# Patient Record
Sex: Female | Born: 1935 | Race: White | Hispanic: No | Smoking: Former smoker
Health system: Southern US, Community
[De-identification: ages and names within clinical notes are randomized; demographics above are authoritative.]

## PROBLEM LIST (undated history)

## (undated) DIAGNOSIS — F32A Depression, unspecified: Secondary | ICD-10-CM

## (undated) DIAGNOSIS — F0280 Dementia in other diseases classified elsewhere without behavioral disturbance: Secondary | ICD-10-CM

## (undated) DIAGNOSIS — G309 Alzheimer's disease, unspecified: Secondary | ICD-10-CM

## (undated) DIAGNOSIS — F329 Major depressive disorder, single episode, unspecified: Secondary | ICD-10-CM

## (undated) DIAGNOSIS — F419 Anxiety disorder, unspecified: Secondary | ICD-10-CM

## (undated) DIAGNOSIS — G2581 Restless legs syndrome: Secondary | ICD-10-CM

## (undated) DIAGNOSIS — G8929 Other chronic pain: Secondary | ICD-10-CM

## (undated) DIAGNOSIS — E039 Hypothyroidism, unspecified: Secondary | ICD-10-CM

## (undated) DIAGNOSIS — J449 Chronic obstructive pulmonary disease, unspecified: Secondary | ICD-10-CM

## (undated) DIAGNOSIS — R0902 Hypoxemia: Secondary | ICD-10-CM

## (undated) DIAGNOSIS — E162 Hypoglycemia, unspecified: Secondary | ICD-10-CM

## (undated) DIAGNOSIS — F0390 Unspecified dementia without behavioral disturbance: Secondary | ICD-10-CM

## (undated) HISTORY — PX: OTHER SURGICAL HISTORY: SHX169

---

## 1998-01-11 ENCOUNTER — Emergency Department (HOSPITAL_COMMUNITY): Admission: EM | Admit: 1998-01-11 | Discharge: 1998-01-11 | Payer: Self-pay | Admitting: Emergency Medicine

## 2004-01-17 ENCOUNTER — Other Ambulatory Visit: Payer: Self-pay

## 2004-03-16 ENCOUNTER — Other Ambulatory Visit: Payer: Self-pay

## 2004-03-16 ENCOUNTER — Emergency Department: Payer: Self-pay | Admitting: Emergency Medicine

## 2004-04-26 ENCOUNTER — Emergency Department: Payer: Self-pay | Admitting: Emergency Medicine

## 2004-04-26 ENCOUNTER — Other Ambulatory Visit: Payer: Self-pay

## 2004-08-19 ENCOUNTER — Inpatient Hospital Stay: Payer: Self-pay | Admitting: Endocrinology

## 2004-08-31 ENCOUNTER — Emergency Department (HOSPITAL_COMMUNITY): Admission: EM | Admit: 2004-08-31 | Discharge: 2004-09-01 | Payer: Self-pay | Admitting: Emergency Medicine

## 2004-09-04 ENCOUNTER — Emergency Department (HOSPITAL_COMMUNITY): Admission: EM | Admit: 2004-09-04 | Discharge: 2004-09-04 | Payer: Self-pay | Admitting: Emergency Medicine

## 2004-10-13 ENCOUNTER — Emergency Department (HOSPITAL_COMMUNITY): Admission: EM | Admit: 2004-10-13 | Discharge: 2004-10-13 | Payer: Self-pay | Admitting: Emergency Medicine

## 2005-04-02 ENCOUNTER — Emergency Department (HOSPITAL_COMMUNITY): Admission: EM | Admit: 2005-04-02 | Discharge: 2005-04-02 | Payer: Self-pay | Admitting: Emergency Medicine

## 2005-12-07 ENCOUNTER — Emergency Department (HOSPITAL_COMMUNITY): Admission: EM | Admit: 2005-12-07 | Discharge: 2005-12-07 | Payer: Self-pay | Admitting: Emergency Medicine

## 2006-01-24 ENCOUNTER — Ambulatory Visit (HOSPITAL_COMMUNITY): Admission: RE | Admit: 2006-01-24 | Discharge: 2006-01-24 | Payer: Self-pay | Admitting: Internal Medicine

## 2006-09-24 ENCOUNTER — Emergency Department (HOSPITAL_COMMUNITY): Admission: EM | Admit: 2006-09-24 | Discharge: 2006-09-24 | Payer: Self-pay | Admitting: Emergency Medicine

## 2006-10-25 ENCOUNTER — Ambulatory Visit (HOSPITAL_COMMUNITY): Admission: RE | Admit: 2006-10-25 | Discharge: 2006-10-25 | Payer: Self-pay | Admitting: *Deleted

## 2007-02-06 ENCOUNTER — Emergency Department (HOSPITAL_COMMUNITY): Admission: EM | Admit: 2007-02-06 | Discharge: 2007-02-07 | Payer: Self-pay | Admitting: Emergency Medicine

## 2007-03-21 ENCOUNTER — Emergency Department (HOSPITAL_COMMUNITY): Admission: EM | Admit: 2007-03-21 | Discharge: 2007-03-21 | Payer: Self-pay | Admitting: Emergency Medicine

## 2007-06-28 ENCOUNTER — Encounter: Admission: RE | Admit: 2007-06-28 | Discharge: 2007-06-28 | Payer: Self-pay | Admitting: *Deleted

## 2007-06-28 ENCOUNTER — Inpatient Hospital Stay (HOSPITAL_COMMUNITY): Admission: EM | Admit: 2007-06-28 | Discharge: 2007-07-07 | Payer: Self-pay | Admitting: Emergency Medicine

## 2007-08-02 ENCOUNTER — Emergency Department (HOSPITAL_COMMUNITY): Admission: EM | Admit: 2007-08-02 | Discharge: 2007-08-02 | Payer: Self-pay | Admitting: Emergency Medicine

## 2008-04-15 ENCOUNTER — Emergency Department (HOSPITAL_COMMUNITY): Admission: EM | Admit: 2008-04-15 | Discharge: 2008-04-15 | Payer: Self-pay | Admitting: Emergency Medicine

## 2008-05-23 ENCOUNTER — Emergency Department (HOSPITAL_COMMUNITY): Admission: EM | Admit: 2008-05-23 | Discharge: 2008-05-23 | Payer: Self-pay | Admitting: Emergency Medicine

## 2008-09-02 ENCOUNTER — Emergency Department (HOSPITAL_COMMUNITY): Admission: EM | Admit: 2008-09-02 | Discharge: 2008-09-03 | Payer: Self-pay | Admitting: Emergency Medicine

## 2008-11-04 ENCOUNTER — Observation Stay (HOSPITAL_COMMUNITY): Admission: EM | Admit: 2008-11-04 | Discharge: 2008-11-05 | Payer: Self-pay | Admitting: Emergency Medicine

## 2009-03-01 ENCOUNTER — Inpatient Hospital Stay (HOSPITAL_COMMUNITY): Admission: EM | Admit: 2009-03-01 | Discharge: 2009-03-03 | Payer: Self-pay | Admitting: Emergency Medicine

## 2009-03-02 ENCOUNTER — Encounter (INDEPENDENT_AMBULATORY_CARE_PROVIDER_SITE_OTHER): Payer: Self-pay | Admitting: Internal Medicine

## 2009-03-02 ENCOUNTER — Ambulatory Visit: Payer: Self-pay | Admitting: Surgery

## 2009-03-09 ENCOUNTER — Encounter (INDEPENDENT_AMBULATORY_CARE_PROVIDER_SITE_OTHER): Payer: Self-pay | Admitting: *Deleted

## 2009-04-21 ENCOUNTER — Encounter: Admission: RE | Admit: 2009-04-21 | Discharge: 2009-04-21 | Payer: Self-pay | Admitting: Geriatric Medicine

## 2009-04-29 ENCOUNTER — Encounter: Admission: RE | Admit: 2009-04-29 | Discharge: 2009-04-29 | Payer: Self-pay | Admitting: Geriatric Medicine

## 2009-05-08 ENCOUNTER — Inpatient Hospital Stay (HOSPITAL_COMMUNITY): Admission: EM | Admit: 2009-05-08 | Discharge: 2009-05-09 | Payer: Self-pay | Admitting: Emergency Medicine

## 2009-06-15 ENCOUNTER — Ambulatory Visit: Payer: Self-pay | Admitting: Diagnostic Radiology

## 2009-06-15 ENCOUNTER — Emergency Department (HOSPITAL_BASED_OUTPATIENT_CLINIC_OR_DEPARTMENT_OTHER): Admission: EM | Admit: 2009-06-15 | Discharge: 2009-06-15 | Payer: Self-pay | Admitting: Emergency Medicine

## 2009-07-14 ENCOUNTER — Ambulatory Visit (HOSPITAL_COMMUNITY): Admission: RE | Admit: 2009-07-14 | Discharge: 2009-07-14 | Payer: Self-pay | Admitting: Geriatric Medicine

## 2009-10-25 ENCOUNTER — Emergency Department (HOSPITAL_COMMUNITY): Admission: EM | Admit: 2009-10-25 | Discharge: 2009-10-25 | Payer: Self-pay | Admitting: Emergency Medicine

## 2009-11-02 IMAGING — CR DG CHEST 1V PORT
1 series · 1 of 1 positions shown · non-contrast
Comparison: 06/28/07 earlier today.

CLINICAL DATA: Fever and shortness of breath.  Pneumonia.  
 PORTABLE CHEST - 1 VIEW 06/28/07:

[view not recorded]
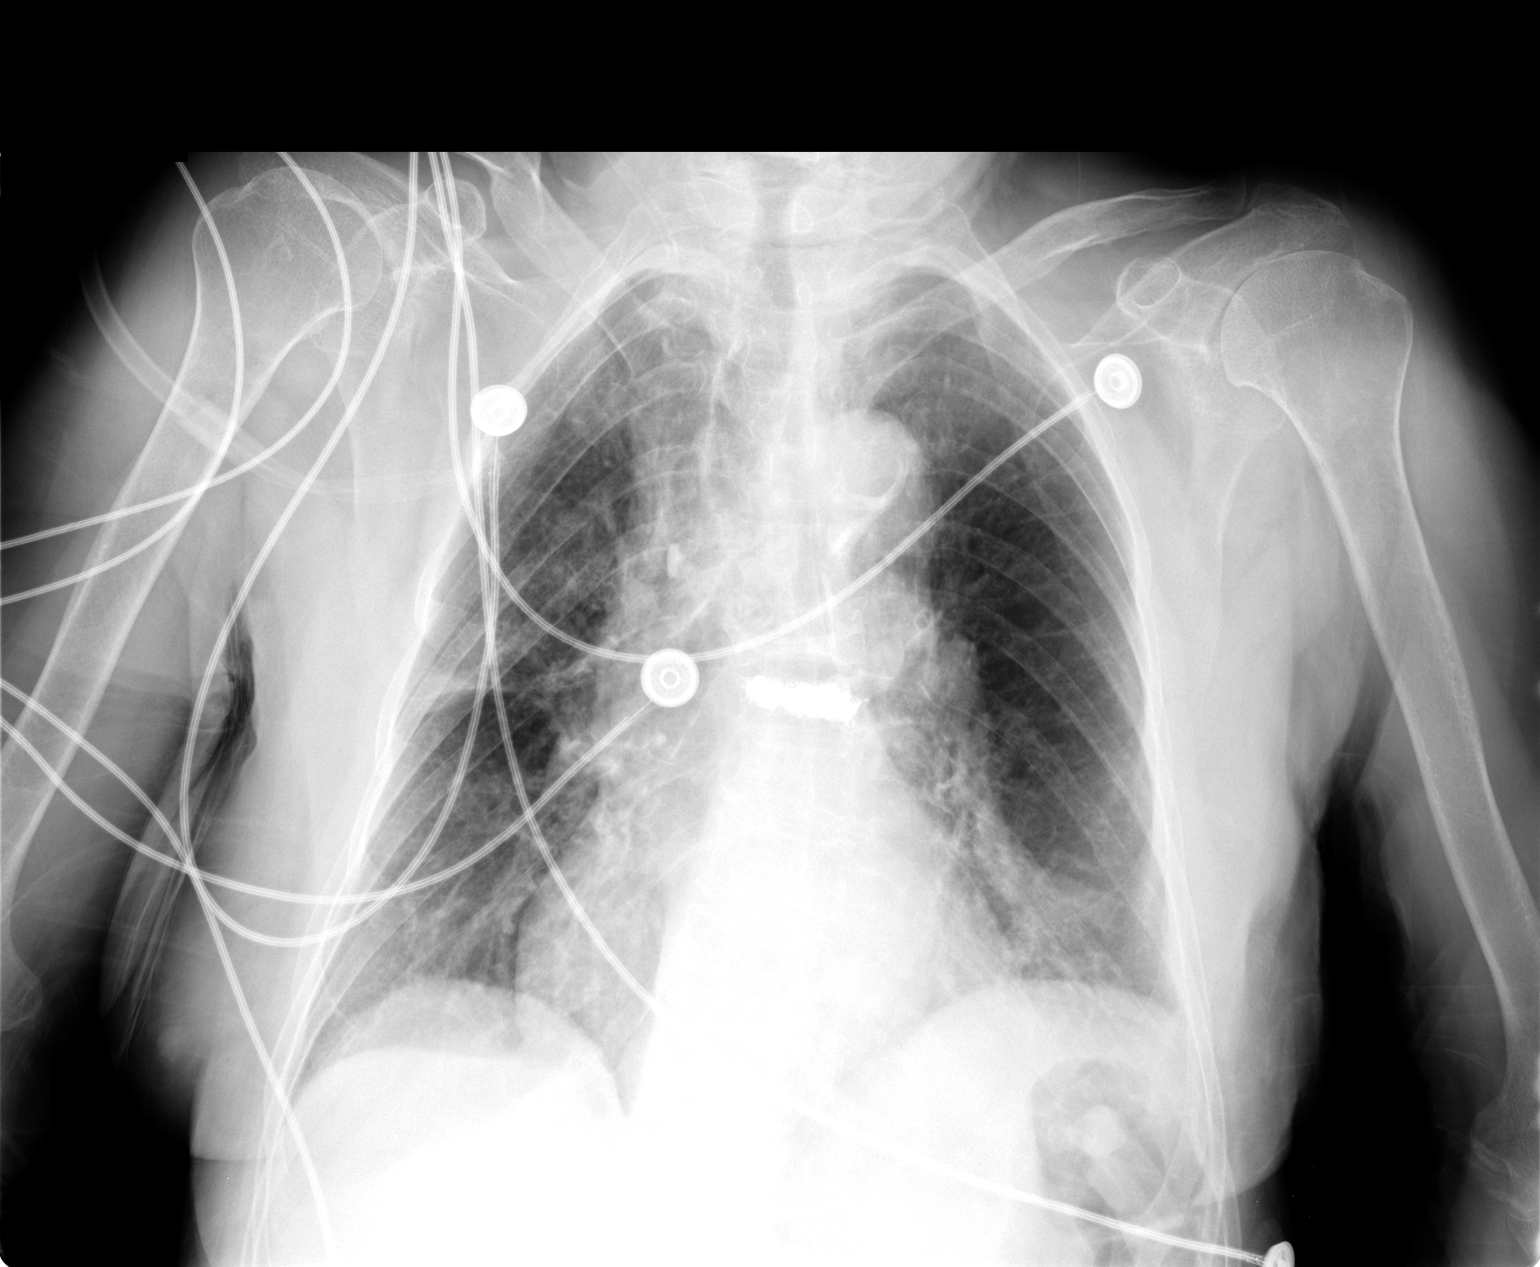

[1 of 1 positions shown; findings below may reference images not displayed]

FINDINGS: Compared to prior study earlier today, patchy infiltrate is again seen within the lingular, consistent with pneumonia.  There is mild atelectasis or scarring in the right mid lung.  Cardiomegaly is stable.  There is no evidence of congestive heart failure.
IMPRESSION: 1.  Lingular infiltrate, without significant change. 
 2.  Mild right midlung atelectasis vs scarring also stable. 
 3.  Stable cardiomegaly.

## 2009-11-02 IMAGING — CR DG CHEST 2V
2 series · 2 of 2 positions shown · non-contrast
Comparison: 03/21/07

CLINICAL DATA: Cough.   Congestion.  Short of breath. 
 CHEST - 2 VIEW:

[view not recorded (1 of 2)]
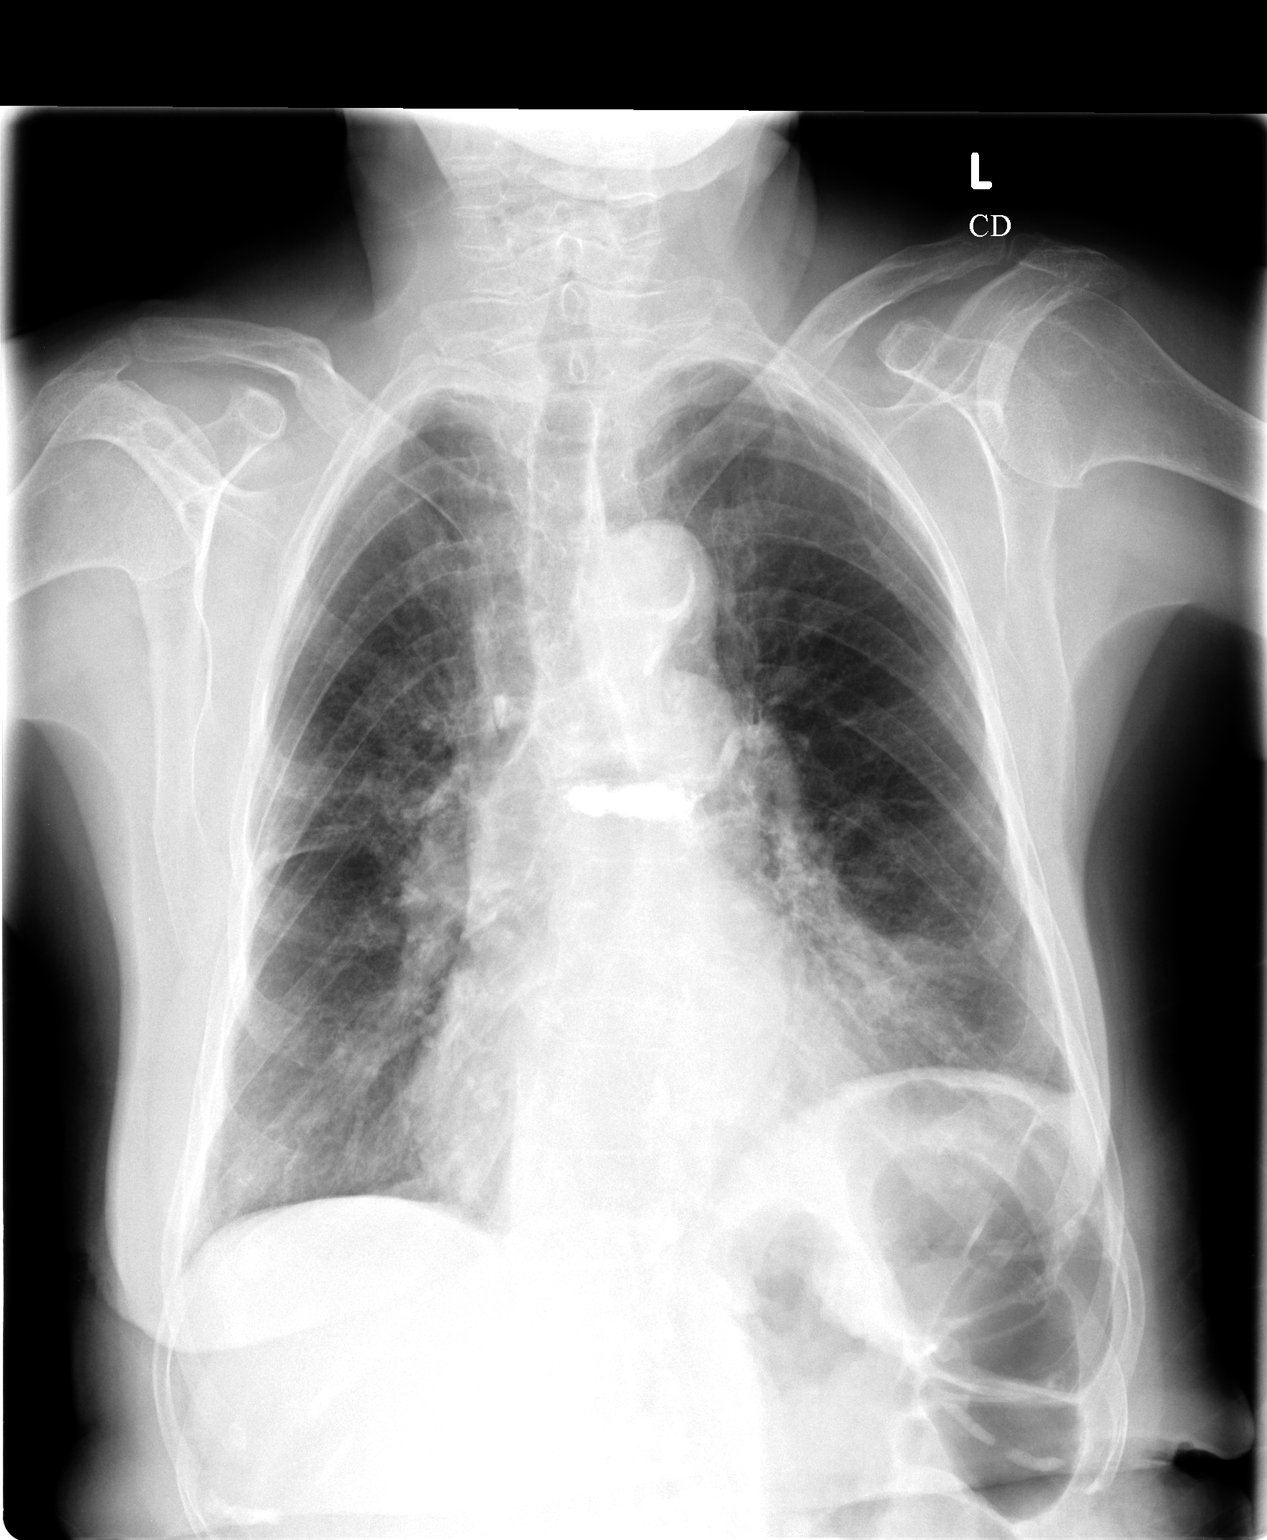

[view not recorded (2 of 2)]
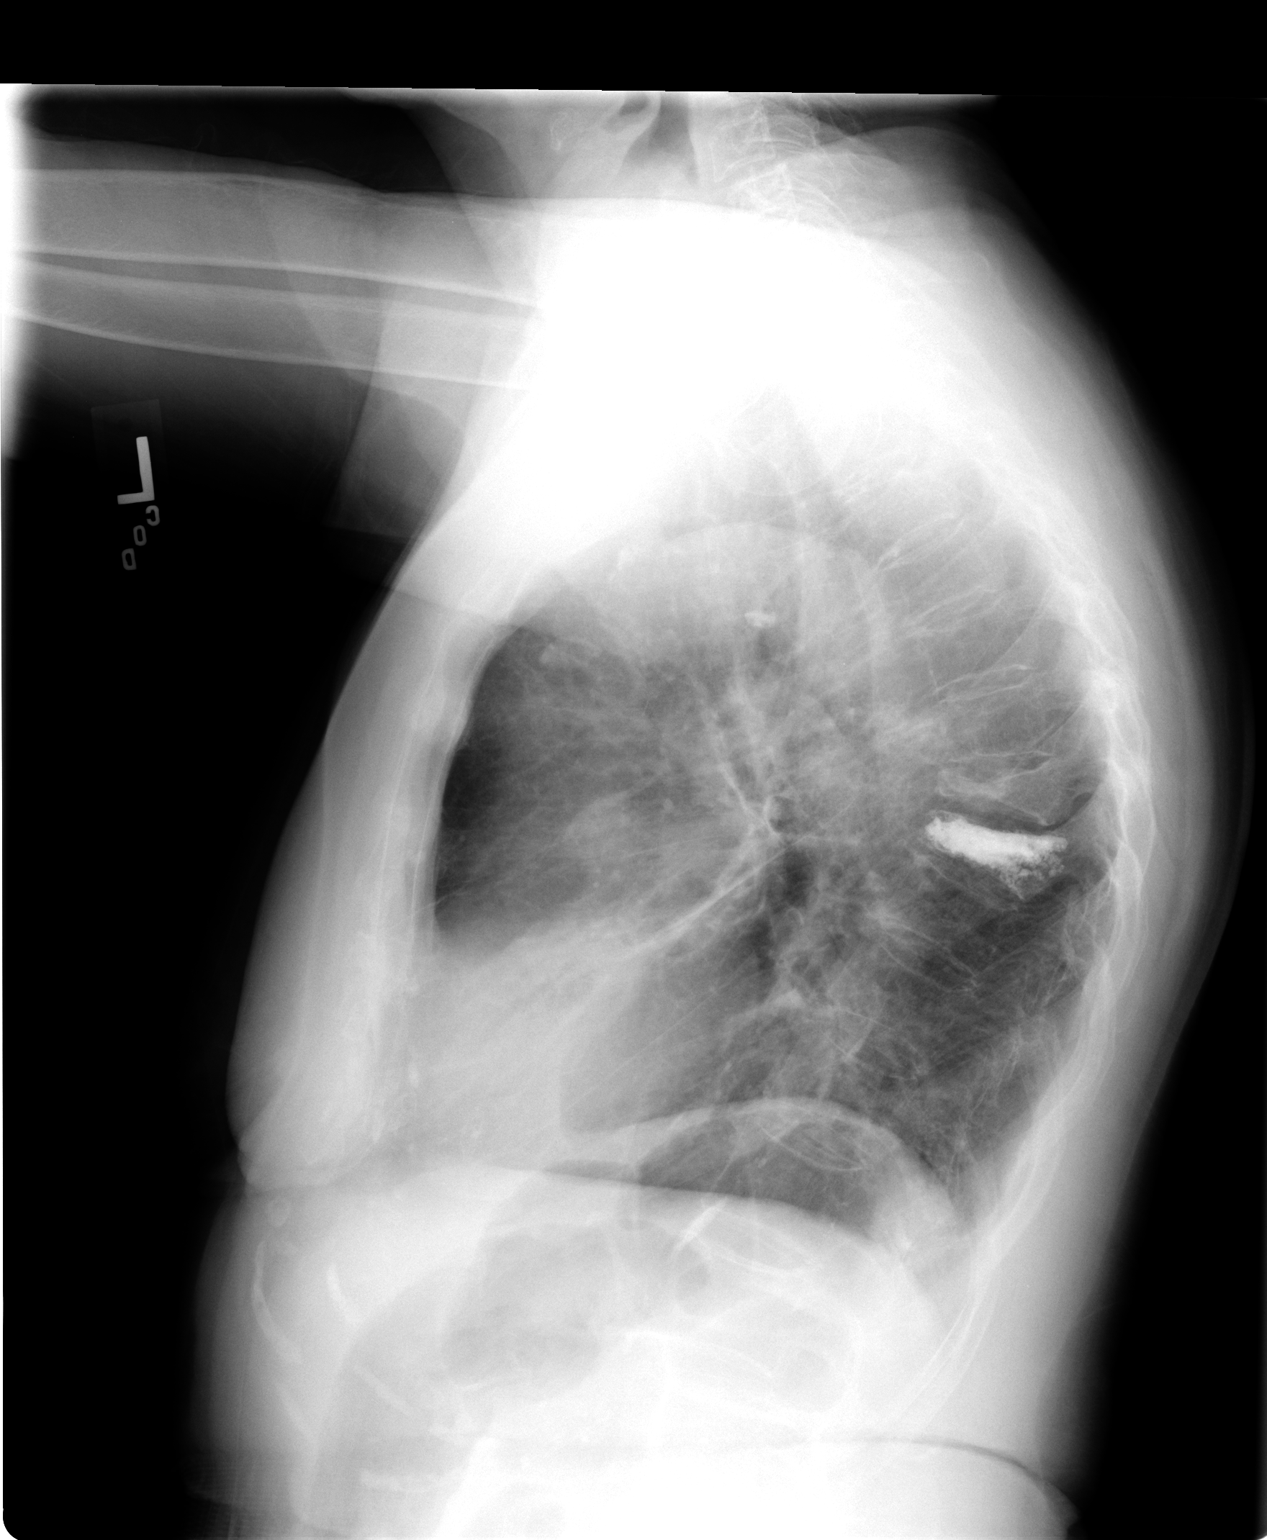

[2 of 2 positions shown; findings below may reference images not displayed]

FINDINGS: Two views of the chest show parenchymal opacity at the left lung base which on the lateral view may involve the lingula, most consistent with pneumonia.  No effusion is seen.  The remainder of the lungs are clear.  Heart size is stable.  The bones are osteopenic and a mid thoracic vertebral body compression deformities again are noted.
IMPRESSION: Patchy opacity anteriorly in region of lingula consistent with lingular pneumonia.

## 2009-12-28 ENCOUNTER — Emergency Department (HOSPITAL_BASED_OUTPATIENT_CLINIC_OR_DEPARTMENT_OTHER): Admission: EM | Admit: 2009-12-28 | Discharge: 2009-12-28 | Payer: Self-pay | Admitting: Emergency Medicine

## 2009-12-28 ENCOUNTER — Ambulatory Visit: Payer: Self-pay | Admitting: Diagnostic Radiology

## 2009-12-29 ENCOUNTER — Ambulatory Visit: Payer: Self-pay | Admitting: Diagnostic Radiology

## 2009-12-29 ENCOUNTER — Emergency Department (HOSPITAL_BASED_OUTPATIENT_CLINIC_OR_DEPARTMENT_OTHER): Admission: EM | Admit: 2009-12-29 | Discharge: 2009-12-29 | Payer: Self-pay | Admitting: Emergency Medicine

## 2010-03-13 ENCOUNTER — Ambulatory Visit: Payer: Self-pay | Admitting: Interventional Radiology

## 2010-03-13 ENCOUNTER — Emergency Department (HOSPITAL_BASED_OUTPATIENT_CLINIC_OR_DEPARTMENT_OTHER): Admission: EM | Admit: 2010-03-13 | Discharge: 2010-03-13 | Payer: Self-pay | Admitting: Emergency Medicine

## 2010-04-02 ENCOUNTER — Emergency Department (HOSPITAL_BASED_OUTPATIENT_CLINIC_OR_DEPARTMENT_OTHER): Admission: EM | Admit: 2010-04-02 | Discharge: 2010-04-02 | Payer: Self-pay | Admitting: Emergency Medicine

## 2010-04-02 ENCOUNTER — Ambulatory Visit: Payer: Self-pay | Admitting: Diagnostic Radiology

## 2010-04-12 ENCOUNTER — Ambulatory Visit: Payer: Self-pay | Admitting: Diagnostic Radiology

## 2010-04-12 ENCOUNTER — Emergency Department (HOSPITAL_BASED_OUTPATIENT_CLINIC_OR_DEPARTMENT_OTHER): Admission: EM | Admit: 2010-04-12 | Discharge: 2010-04-12 | Payer: Self-pay | Admitting: Emergency Medicine

## 2010-04-30 ENCOUNTER — Encounter (INDEPENDENT_AMBULATORY_CARE_PROVIDER_SITE_OTHER): Payer: Self-pay | Admitting: *Deleted

## 2010-04-30 ENCOUNTER — Ambulatory Visit: Payer: Self-pay | Admitting: Diagnostic Radiology

## 2010-04-30 ENCOUNTER — Emergency Department (HOSPITAL_BASED_OUTPATIENT_CLINIC_OR_DEPARTMENT_OTHER): Admission: EM | Admit: 2010-04-30 | Discharge: 2010-04-30 | Payer: Self-pay | Admitting: Emergency Medicine

## 2010-05-10 ENCOUNTER — Emergency Department (HOSPITAL_BASED_OUTPATIENT_CLINIC_OR_DEPARTMENT_OTHER): Admission: EM | Admit: 2010-05-10 | Discharge: 2010-05-10 | Payer: Self-pay | Admitting: Emergency Medicine

## 2010-05-10 ENCOUNTER — Encounter (INDEPENDENT_AMBULATORY_CARE_PROVIDER_SITE_OTHER): Payer: Self-pay | Admitting: *Deleted

## 2010-05-20 ENCOUNTER — Emergency Department (HOSPITAL_BASED_OUTPATIENT_CLINIC_OR_DEPARTMENT_OTHER)
Admission: EM | Admit: 2010-05-20 | Discharge: 2010-05-20 | Payer: Self-pay | Source: Home / Self Care | Admitting: Emergency Medicine

## 2010-05-25 ENCOUNTER — Encounter (INDEPENDENT_AMBULATORY_CARE_PROVIDER_SITE_OTHER): Payer: Self-pay | Admitting: *Deleted

## 2010-05-26 ENCOUNTER — Telehealth: Payer: Self-pay | Admitting: Gastroenterology

## 2010-07-02 ENCOUNTER — Ambulatory Visit: Admit: 2010-07-02 | Payer: Self-pay | Admitting: Gastroenterology

## 2010-07-15 NOTE — Progress Notes (Signed)
Summary: triage  Phone Note From Other Clinic Call back at [phone]   Caller: Aldean Jewett, with [facility] Call For: Dr. Christella Hartigan Reason for Call: Schedule Patient Appt Summary of Call: wants pt to be seen sooner than currently scheduled- January 20th Initial call taken by: Vallarie Mare,  May 26, 2010 8:27 AM  Follow-up for Phone Call        pt is not having any problems just a follow up from the ER for hemorrhoids.  Dr Christella Hartigan has no appts any sooner,  she will call back if the pt develops symptoms or problems to be triaged Follow-up by: Chales Abrahams CMA Duncan Dull),  May 26, 2010 8:47 AM

## 2010-07-15 NOTE — Discharge Summary (Signed)
Summary: Abdominal pain  NAME:  Erin Davis, Erin Davis              ACCOUNT NO.:  [id number]      MEDICAL RECORD NO.:  [id number]          PATIENT TYPE:  INP      LOCATION:  1517                         FACILITY:  [facility]      PHYSICIAN:  Jeoffrey Massed, MD    DATE OF BIRTH:  06-16-1921      DATE OF ADMISSION:  05/08/2009   DATE OF DISCHARGE:  05/09/2009                                  DISCHARGE SUMMARY      PRIMARY CARE PRACTITIONER:  Dr. Tamsen Meek.  Patient is a resident of   [facility].      PRIMARY DISCHARGE DIAGNOSES:   1. Abdominal pain.   2. Urinary tract infection.      SECONDARY DISCHARGE DIAGNOSES:   1. History of chronic obstructive pulmonary disease.   2. Hypothyroidism.   3. Dementia.   4. History of recurrent urinary tract infection.   5. History of rectal bleeding.   6. Chronic pain syndrome.   7. Restless leg syndrome.   8. Anxiety.   9. Depression.      DISCHARGE MEDICATIONS:  Includes:   1. Bactrim-DS 1 tab twice daily for 3 days.   2. Aricept 10 mg 1 tab daily q.h.s.   3. Aspirin 1 tab p.o. daily.   4. Bacitracin ointment 1 application topically 3 times a day.   5. Carnation Instant B-Fast 1 packet p.o. every 8 hours.   6. Desonide 0.05% cream 1 application topically twice daily.   7. Imodium 2 mg 1 tablet daily p.r.n.   8. Mycostatin 100,000 units/gram 1 application topically daily as       needed.   9. Namenda 10 mg 1 tablet p.o. q.12.   10.Potassium chloride liquid 15 mL p.o. daily.   11.Simvastatin 5 mg 1 tablet p.o. daily at bedtime.   12.Synthroid 25 mcg 1.5 tablets p.o. daily.   13.Trazodone 50 mg 1 tablet p.o. daily at bedtime.   14.Tylenol Extra Strength 1 tablet p.o. q.6 p.r.n.   15.Vitamin D 3000 units 1 capsule p.o. daily.   16.Xopenex nebulizer 0.63 mg 1 nebulization inhaled q.8 hours..   17.Baza cream 1  application topically daily as needed.   18.Delsym 1 tablespoon p.o. q.12 p.r.n.      CONSULTATION:  Patient was seen in consult by  Dr. Johna Sheriff from Surgery   on May 08, 2009.      BRIEF HISTORY AND PHYSICAL:  An 77 year old female with a history of   dementia, chronic pain syndrome, admitted with abdominal pain.  Patient   is a very poor historian and apparently described the pain to have   started around 2 to 3 days ago and had become severe on the day of   admission.  She was sent over to the [facility] ED for further   evaluation and treatment.  She was then admitted to the [facility].  For further details, please see the history and   physical that was dictated by Dr. Lytle Michaels. Kim on the day of admission.  PERTINENT RADIOLOGICAL STUDIES:   1. An x-ray of the abdomen was done, which shows gaseous distention of       the stomach and several small bowel loops.  Partial low-grade small       bowel obstruction cannot be excluded.   2. Cardiomegaly and COPD; no active lung disease.   3. CT of the abdomen and pelvis, which showed:       a.     Multiple small hypodensities within both kidneys likely        reflecting small cysts.       b.     Linear atelectasis, scarring at the right lung base.       c.     A 6 mm nodular density along the left basilar pleura;        malignancy cannot be entirely excluded.       d.     No acute abnormalities identified within the pelvis.   4. A CT of the chest without contrast, which showed a solitary 6 mm       indeterminate pulmonary nodule in the left lateral lung base.      PERTINENT LABORATORY DATA:   1. CBC showed a WBC of 7, hemoglobin of 13.6, and a platelet count of       240.   2. Chemistry showed a sodium of 141, potassium of 4.1, creatinine of       1.14.   3. Lipase was normal at 58.      A urinalyses showed nitrite positive, moderate leukocytes.      Urine microscopic analysis showed WBCs 3 to 6 and many bacteria.      BRIEF HOSPITAL COURSE:   1. Abdominal pain.  The patient was admitted with abdominal pain.  A       surgical  consult was obtained in the ER and was felt to be a       nonsurgical issue.  By the time the Hospitalist Team went to admit       the patient, the patient was utterly pain free.  However, a CT of       the abdomen and pelvis was ordered, it was done, and it showed no       acute abnormality.  The patient has had no further abdominal pain       nor has she had any nausea or vomiting in the hospital.  Her diet       has already been advanced to as tolerated.  She is completely pain       free.  The etiology of the pain has not yet been determined;       however, it could be gastritis or a change in gastroparesis or       gastroenteritis.   2. UTI.Marland Kitchen  Her urinalysis was dirty.  The patient will be discharged on       a 3-day course of Bactrim.   3. COPD.  Stable during admission.   4. History of dementia.  Will continue the Aricept and Namenda.   5. Hypothyroidism.  Will continue Synthroid.   6. A left lower lung nodule 6 mm in size.  The history is unreliable,       but the patient claims to be an ex-smoker.  Will need to recheck a       CT of the chest in 6 months time.   7. Hyperlipidemia.  Continue Zocor.  DISPOSITION:  The patient to be discharged back to the nursing home.      CONDITION ON DISCHARGE:  Stable, pain free.      FOLLOWUP:   1. The patient has a 6 mm pulmonary nodule in her lung.  Given her       questionable history of smoking, it is recommended that a CT of the       chest be followed up in 6 months time.   2. Follow up with the primary care practitioner in 1 week time.               Jeoffrey Massed, MD   Electronically Signed            SG/MEDQ  D:  05/09/2009  T:  05/09/2009  Job:  [id number]

## 2010-07-15 NOTE — Letter (Signed)
Summary: New Patient letter  [facility]  [address] [place], [place] [address]   Phone: [phone]  Fax: [phone]       05/25/2010 MRN: [id number]  Erin Davis [address] [place], [place]  [address]  Dear Ms. Eischen,  Welcome to the Gastroenterology Division at [facility].    You are scheduled to see Dr.  Christella Hartigan on 07-02-10 at 1:30p.m. on the 3rd floor at [facility], [address]  We ask that you try to arrive at our office 15 minutes prior to your appointment time to allow for check-in.  We would like you to complete the enclosed self-administered evaluation form prior to your visit and bring it with you on the day of your appointment.  We will review it with you.  Also, please bring a complete list of all your medications or, if you prefer, bring the medication bottles and we will list them.  Please bring your insurance card so that we may make a copy of it.  If your insurance requires a referral to see a specialist, please bring your referral form from your primary care physician.  Co-payments are due at the time of your visit and may be paid by cash, check or credit card.     Your office visit will consist of a consult with your physician (includes a physical exam), any laboratory testing he/she may order, scheduling of any necessary diagnostic testing (e.g. x-ray, ultrasound, CT-scan), and scheduling of a procedure (e.g. Endoscopy, Colonoscopy) if required.  Please allow enough time on your schedule to allow for any/all of these possibilities.    If you cannot keep your appointment, please call [phone] to cancel or reschedule prior to your appointment date.  This allows [place] the opportunity to schedule an appointment for another patient in need of care.  If you do not cancel or reschedule by 5 p.m. the business day prior to your appointment date, you will be charged a $50.00 late cancellation/no-show fee.    Thank you for choosing  [facility] for your medical needs.  We appreciate the opportunity to care for you.  Please visit [place] at our website  to learn more about our practice.                     Sincerely,                                                             The Gastroenterology Division

## 2010-08-16 ENCOUNTER — Ambulatory Visit: Payer: Self-pay | Admitting: Gastroenterology

## 2010-08-24 LAB — URINALYSIS, ROUTINE W REFLEX MICROSCOPIC
Bilirubin Urine: NEGATIVE
Bilirubin Urine: NEGATIVE
Glucose, UA: NEGATIVE mg/dL
Hgb urine dipstick: NEGATIVE
Hgb urine dipstick: NEGATIVE
Ketones, ur: NEGATIVE mg/dL
Leukocytes, UA: NEGATIVE
Nitrite: NEGATIVE
Nitrite: NEGATIVE
Protein, ur: 30 mg/dL — AB
Specific Gravity, Urine: 1.025 (ref 1.005–1.030)
Specific Gravity, Urine: 1.026 (ref 1.005–1.030)
Urobilinogen, UA: 0.2 mg/dL (ref 0.0–1.0)
Urobilinogen, UA: 0.2 mg/dL (ref 0.0–1.0)
pH: 5.5 (ref 5.0–8.0)
pH: 5.5 (ref 5.0–8.0)

## 2010-08-24 LAB — BASIC METABOLIC PANEL
Calcium: 9.2 mg/dL (ref 8.4–10.5)
GFR calc Af Amer: >60 mL/min (ref >60)
GFR calc non Af Amer: >60 mL/min (ref >60)
Glucose, Bld: 97 mg/dL (ref 70–99)
Potassium: 4.4 mEq/L (ref 3.5–5.1)
Sodium: 146 mEq/L — ABNORMAL HIGH (ref 135–145)

## 2010-08-24 LAB — DIFFERENTIAL
Basophils Relative: 1 % (ref 0–1)
Lymphocytes Relative: 26 % (ref 12–46)
Lymphs Abs: 1.3 10*3/uL (ref 0.7–4.0)
Monocytes Absolute: 0.6 10*3/uL (ref 0.1–1.0)
Monocytes Relative: 12 % (ref 3–12)
Neutro Abs: 3 10*3/uL (ref 1.7–7.7)
Neutrophils Relative %: 60 % (ref 43–77)

## 2010-08-24 LAB — URINE CULTURE
Colony Count: NO GROWTH
Culture  Setup Time: 201111190121
Culture: NO GROWTH

## 2010-08-24 LAB — URINE MICROSCOPIC-ADD ON

## 2010-08-24 LAB — CBC
HCT: 37.9 % (ref 36.0–46.0)
Hemoglobin: 12.9 g/dL (ref 12.0–15.0)
RBC: 4.08 MIL/uL (ref 3.87–5.11)

## 2010-08-24 LAB — HEMOCCULT GUIAC POC 1CARD (OFFICE): Fecal Occult Bld: NEGATIVE

## 2010-08-25 LAB — CBC
HCT: 39.2 % (ref 36.0–46.0)
Hemoglobin: 13.1 g/dL (ref 12.0–15.0)
Hemoglobin: 13.4 g/dL (ref 12.0–15.0)
MCH: 32.7 pg (ref 26.0–34.0)
MCHC: 33.5 g/dL (ref 30.0–36.0)
MCV: 96.6 fL (ref 78.0–100.0)
Platelets: 209 10*3/uL (ref 150–400)
RBC: 4.09 MIL/uL (ref 3.87–5.11)
WBC: 5.3 10*3/uL (ref 4.0–10.5)
WBC: 6.4 10*3/uL (ref 4.0–10.5)

## 2010-08-25 LAB — COMPREHENSIVE METABOLIC PANEL WITH GFR
ALT: 28 U/L (ref 0–35)
AST: 32 U/L (ref 0–37)
Albumin: 4.1 g/dL (ref 3.5–5.2)
Alkaline Phosphatase: 79 U/L (ref 39–117)
BUN: 25 mg/dL — ABNORMAL HIGH (ref 6–23)
CO2: 27 meq/L (ref 19–32)
Calcium: 9.8 mg/dL (ref 8.4–10.5)
Chloride: 105 meq/L (ref 96–112)
Creatinine, Ser: 1.1 mg/dL (ref 0.4–1.2)
GFR calc non Af Amer: 47 mL/min — ABNORMAL LOW
Glucose, Bld: 101 mg/dL — ABNORMAL HIGH (ref 70–99)
Potassium: 3.9 meq/L (ref 3.5–5.1)
Sodium: 143 meq/L (ref 135–145)
Total Bilirubin: 0.4 mg/dL (ref 0.3–1.2)
Total Protein: 6.7 g/dL (ref 6.0–8.3)

## 2010-08-25 LAB — URINALYSIS, ROUTINE W REFLEX MICROSCOPIC
Bilirubin Urine: NEGATIVE
Bilirubin Urine: NEGATIVE
Glucose, UA: NEGATIVE mg/dL
Ketones, ur: NEGATIVE mg/dL
Nitrite: NEGATIVE
Nitrite: NEGATIVE
Protein, ur: 30 mg/dL — AB
Specific Gravity, Urine: 1.017 (ref 1.005–1.030)
Specific Gravity, Urine: 1.021 (ref 1.005–1.030)
Urobilinogen, UA: 0.2 mg/dL (ref 0.0–1.0)
Urobilinogen, UA: 0.2 mg/dL (ref 0.0–1.0)
pH: 6 (ref 5.0–8.0)
pH: 6.5 (ref 5.0–8.0)

## 2010-08-25 LAB — DIFFERENTIAL
Basophils Absolute: 0 K/uL (ref 0.0–0.1)
Basophils Absolute: 0 K/uL (ref 0.0–0.1)
Basophils Relative: 1 % (ref 0–1)
Basophils Relative: 1 % (ref 0–1)
Eosinophils Absolute: 0.1 K/uL (ref 0.0–0.7)
Eosinophils Absolute: 0.1 K/uL (ref 0.0–0.7)
Eosinophils Relative: 1 % (ref 0–5)
Eosinophils Relative: 2 % (ref 0–5)
Lymphocytes Relative: 22 % (ref 12–46)
Lymphocytes Relative: 26 % (ref 12–46)
Lymphs Abs: 1.4 K/uL (ref 0.7–4.0)
Lymphs Abs: 1.4 K/uL (ref 0.7–4.0)
Monocytes Absolute: 0.6 K/uL (ref 0.1–1.0)
Monocytes Absolute: 0.6 K/uL (ref 0.1–1.0)
Monocytes Relative: 11 % (ref 3–12)
Monocytes Relative: 9 % (ref 3–12)
Neutro Abs: 3.2 K/uL (ref 1.7–7.7)
Neutro Abs: 4.3 K/uL (ref 1.7–7.7)
Neutrophils Relative %: 62 % (ref 43–77)
Neutrophils Relative %: 68 % (ref 43–77)

## 2010-08-25 LAB — BASIC METABOLIC PANEL
Chloride: 105 mEq/L (ref 96–112)
GFR calc non Af Amer: 59 mL/min — ABNORMAL LOW (ref >60)
Glucose, Bld: 121 mg/dL — ABNORMAL HIGH (ref 70–99)
Potassium: 3.7 mEq/L (ref 3.5–5.1)
Sodium: 146 mEq/L — ABNORMAL HIGH (ref 135–145)

## 2010-08-25 LAB — URINE CULTURE

## 2010-08-25 LAB — URINE MICROSCOPIC-ADD ON

## 2010-08-26 LAB — WOUND CULTURE: Gram Stain: NONE SEEN

## 2010-08-26 LAB — DIFFERENTIAL
Basophils Absolute: 0.1 10*3/uL (ref 0.0–0.1)
Basophils Relative: 1 % (ref 0–1)
Monocytes Absolute: 1 10*3/uL (ref 0.1–1.0)
Neutro Abs: 7.3 10*3/uL (ref 1.7–7.7)

## 2010-08-26 LAB — CBC
HCT: 40.2 % (ref 36.0–46.0)
MCHC: 33.7 g/dL (ref 30.0–36.0)
RDW: 13.2 % (ref 11.5–15.5)

## 2010-08-26 LAB — BASIC METABOLIC PANEL
BUN: 15 mg/dL (ref 6–23)
Calcium: 9 mg/dL (ref 8.4–10.5)
GFR calc non Af Amer: >60 mL/min (ref >60)
Glucose, Bld: 91 mg/dL (ref 70–99)
Potassium: 4.3 mEq/L (ref 3.5–5.1)

## 2010-08-28 LAB — URINE MICROSCOPIC-ADD ON

## 2010-08-28 LAB — DIFFERENTIAL
Basophils Absolute: 0 10*3/uL (ref 0.0–0.1)
Eosinophils Absolute: 0.1 10*3/uL (ref 0.0–0.7)
Eosinophils Relative: 2 % (ref 0–5)
Lymphocytes Relative: 13 % (ref 12–46)
Lymphocytes Relative: 21 % (ref 12–46)
Lymphs Abs: 1.3 10*3/uL (ref 0.7–4.0)
Monocytes Absolute: 0.6 10*3/uL (ref 0.1–1.0)
Monocytes Relative: 10 % (ref 3–12)
Neutro Abs: 6.9 10*3/uL (ref 1.7–7.7)
Neutrophils Relative %: 78 % — ABNORMAL HIGH (ref 43–77)

## 2010-08-28 LAB — URINALYSIS, ROUTINE W REFLEX MICROSCOPIC
Bilirubin Urine: NEGATIVE
Bilirubin Urine: NEGATIVE
Glucose, UA: NEGATIVE mg/dL
Hgb urine dipstick: NEGATIVE
Ketones, ur: 15 mg/dL — AB
Ketones, ur: NEGATIVE mg/dL
Nitrite: NEGATIVE
Protein, ur: NEGATIVE mg/dL
Urobilinogen, UA: 0.2 mg/dL (ref 0.0–1.0)
pH: 5.5 (ref 5.0–8.0)

## 2010-08-28 LAB — CBC
HCT: 36.2 % (ref 36.0–46.0)
Hemoglobin: 13.4 g/dL (ref 12.0–15.0)
MCHC: 32.6 g/dL (ref 30.0–36.0)
Platelets: 210 10*3/uL (ref 150–400)
Platelets: 232 10*3/uL (ref 150–400)
RBC: 4.39 MIL/uL (ref 3.87–5.11)
RDW: 13.2 % (ref 11.5–15.5)
WBC: 8.8 10*3/uL (ref 4.0–10.5)

## 2010-08-28 LAB — BASIC METABOLIC PANEL
BUN: 17 mg/dL (ref 6–23)
Creatinine, Ser: 0.9 mg/dL (ref 0.4–1.2)
GFR calc non Af Amer: 59 mL/min — ABNORMAL LOW (ref >60)
Potassium: 3.8 mEq/L (ref 3.5–5.1)

## 2010-08-28 LAB — COMPREHENSIVE METABOLIC PANEL
ALT: 21 U/L (ref 0–35)
AST: 28 U/L (ref 0–37)
Albumin: 4.1 g/dL (ref 3.5–5.2)
CO2: 28 mEq/L (ref 19–32)
Calcium: 9.1 mg/dL (ref 8.4–10.5)
GFR calc Af Amer: >60 mL/min (ref >60)
GFR calc non Af Amer: 53 mL/min — ABNORMAL LOW (ref >60)
Sodium: 146 mEq/L — ABNORMAL HIGH (ref 135–145)

## 2010-08-28 LAB — URINE CULTURE: Colony Count: 50000

## 2010-08-28 LAB — POCT CARDIAC MARKERS
CKMB, poc: <1 ng/mL — ABNORMAL LOW (ref 1.0–8.0)
Myoglobin, poc: 76.7 ng/mL (ref 12–200)
Troponin i, poc: <0.05 ng/mL (ref 0.00–0.09)

## 2010-08-30 LAB — DIFFERENTIAL
Lymphocytes Relative: 17 % (ref 12–46)
Lymphs Abs: 1.2 10*3/uL (ref 0.7–4.0)
Monocytes Relative: 9 % (ref 3–12)
Neutro Abs: 4.9 10*3/uL (ref 1.7–7.7)
Neutrophils Relative %: 72 % (ref 43–77)

## 2010-08-30 LAB — CBC
MCV: 95.2 fL (ref 78.0–100.0)
Platelets: 214 10*3/uL (ref 150–400)
RBC: 4.09 MIL/uL (ref 3.87–5.11)
WBC: 6.9 10*3/uL (ref 4.0–10.5)

## 2010-08-30 LAB — BASIC METABOLIC PANEL
BUN: 13 mg/dL (ref 6–23)
Calcium: 8.7 mg/dL (ref 8.4–10.5)
Chloride: 107 mEq/L (ref 96–112)
Creatinine, Ser: 0.88 mg/dL (ref 0.4–1.2)
GFR calc Af Amer: >60 mL/min (ref >60)
GFR calc non Af Amer: >60 mL/min (ref >60)

## 2010-08-30 LAB — URINALYSIS, ROUTINE W REFLEX MICROSCOPIC
Glucose, UA: NEGATIVE mg/dL
Ketones, ur: NEGATIVE mg/dL
Nitrite: POSITIVE — AB
Specific Gravity, Urine: 1.013 (ref 1.005–1.030)
pH: 6 (ref 5.0–8.0)

## 2010-08-30 LAB — URINE MICROSCOPIC-ADD ON

## 2010-09-15 LAB — URINALYSIS, ROUTINE W REFLEX MICROSCOPIC
Glucose, UA: NEGATIVE mg/dL
Hgb urine dipstick: NEGATIVE
Protein, ur: NEGATIVE mg/dL
Specific Gravity, Urine: 1.023 (ref 1.005–1.030)
pH: 6 (ref 5.0–8.0)

## 2010-09-15 LAB — DIFFERENTIAL
Eosinophils Relative: 2 % (ref 0–5)
Lymphocytes Relative: 14 % (ref 12–46)
Monocytes Absolute: 0.5 10*3/uL (ref 0.1–1.0)
Monocytes Relative: 7 % (ref 3–12)
Neutro Abs: 5.3 10*3/uL (ref 1.7–7.7)

## 2010-09-15 LAB — URINE MICROSCOPIC-ADD ON

## 2010-09-15 LAB — CBC
HCT: 40.8 % (ref 36.0–46.0)
MCV: 94.5 fL (ref 78.0–100.0)
Platelets: 240 10*3/uL (ref 150–400)
RDW: 13.5 % (ref 11.5–15.5)
WBC: 7 10*3/uL (ref 4.0–10.5)

## 2010-09-15 LAB — COMPREHENSIVE METABOLIC PANEL
AST: 24 U/L (ref 0–37)
Albumin: 3.9 g/dL (ref 3.5–5.2)
BUN: 26 mg/dL — ABNORMAL HIGH (ref 6–23)
Chloride: 104 mEq/L (ref 96–112)
Creatinine, Ser: 1.14 mg/dL (ref 0.4–1.2)
GFR calc Af Amer: 55 mL/min — ABNORMAL LOW (ref >60)
Potassium: 4.1 mEq/L (ref 3.5–5.1)
Total Protein: 7 g/dL (ref 6.0–8.3)

## 2010-09-17 LAB — URINALYSIS, ROUTINE W REFLEX MICROSCOPIC
Glucose, UA: NEGATIVE mg/dL
Nitrite: POSITIVE — AB
Specific Gravity, Urine: 1.017 (ref 1.005–1.030)
pH: 7 (ref 5.0–8.0)

## 2010-09-17 LAB — DIFFERENTIAL
Basophils Absolute: 0.1 10*3/uL (ref 0.0–0.1)
Eosinophils Absolute: 0.1 10*3/uL (ref 0.0–0.7)
Eosinophils Relative: 2 % (ref 0–5)
Lymphocytes Relative: 20 % (ref 12–46)
Lymphs Abs: 1.4 10*3/uL (ref 0.7–4.0)
Monocytes Absolute: 0.4 10*3/uL (ref 0.1–1.0)
Monocytes Absolute: 0.8 10*3/uL (ref 0.1–1.0)
Monocytes Relative: 4 % (ref 3–12)
Neutrophils Relative %: 79 % — ABNORMAL HIGH (ref 43–77)

## 2010-09-17 LAB — CBC
HCT: 41.6 % (ref 36.0–46.0)
Hemoglobin: 13.9 g/dL (ref 12.0–15.0)
MCV: 95.1 fL (ref 78.0–100.0)
Platelets: 222 10*3/uL (ref 150–400)
RBC: 4.37 MIL/uL (ref 3.87–5.11)
WBC: 7.5 10*3/uL (ref 4.0–10.5)
WBC: 9.7 10*3/uL (ref 4.0–10.5)

## 2010-09-17 LAB — URINE MICROSCOPIC-ADD ON

## 2010-09-17 LAB — CARDIAC PANEL(CRET KIN+CKTOT+MB+TROPI)
CK, MB: 6.9 ng/mL — ABNORMAL HIGH (ref 0.3–4.0)
Relative Index: 1.7 (ref 0.0–2.5)
Total CK: 397 U/L — ABNORMAL HIGH (ref 7–177)
Total CK: 404 U/L — ABNORMAL HIGH (ref 7–177)
Troponin I: <0.01 ng/mL (ref 0.00–0.06)
Troponin I: <0.01 ng/mL (ref 0.00–0.06)

## 2010-09-17 LAB — POCT I-STAT, CHEM 8
BUN: 28 mg/dL — ABNORMAL HIGH (ref 6–23)
Creatinine, Ser: 1.3 mg/dL — ABNORMAL HIGH (ref 0.4–1.2)
Glucose, Bld: 118 mg/dL — ABNORMAL HIGH (ref 70–99)
Sodium: 140 mEq/L (ref 135–145)
TCO2: 27 mmol/L (ref 0–100)

## 2010-09-17 LAB — BASIC METABOLIC PANEL
Chloride: 109 mEq/L (ref 96–112)
GFR calc Af Amer: 58 mL/min — ABNORMAL LOW (ref >60)
Potassium: 4.1 mEq/L (ref 3.5–5.1)

## 2010-09-17 LAB — POCT CARDIAC MARKERS
CKMB, poc: 3.7 ng/mL (ref 1.0–8.0)
Myoglobin, poc: 182 ng/mL (ref 12–200)
Myoglobin, poc: 358 ng/mL (ref 12–200)

## 2010-09-17 LAB — GLUCOSE, CAPILLARY

## 2010-09-17 LAB — URINE CULTURE: Colony Count: >=100000

## 2010-09-17 LAB — LIPID PANEL: HDL: 63 mg/dL (ref >39)

## 2010-09-17 LAB — TSH: TSH: 1.476 u[IU]/mL (ref 0.350–4.500)

## 2010-09-21 LAB — CBC
HCT: 37.7 % (ref 36.0–46.0)
Hemoglobin: 13.3 g/dL (ref 12.0–15.0)
MCHC: 33.9 g/dL (ref 30.0–36.0)
MCV: 92.8 fL (ref 78.0–100.0)
RBC: 3.83 MIL/uL — ABNORMAL LOW (ref 3.87–5.11)
RBC: 4.06 MIL/uL (ref 3.87–5.11)
RDW: 13.8 % (ref 11.5–15.5)
WBC: 6.3 10*3/uL (ref 4.0–10.5)

## 2010-09-21 LAB — DIFFERENTIAL
Eosinophils Absolute: 0.2 10*3/uL (ref 0.0–0.7)
Eosinophils Relative: 3 % (ref 0–5)
Lymphs Abs: 1.5 10*3/uL (ref 0.7–4.0)
Monocytes Absolute: 0.4 10*3/uL (ref 0.1–1.0)
Monocytes Relative: 7 % (ref 3–12)

## 2010-09-21 LAB — URINALYSIS, ROUTINE W REFLEX MICROSCOPIC
Glucose, UA: NEGATIVE mg/dL
Ketones, ur: NEGATIVE mg/dL
Protein, ur: NEGATIVE mg/dL

## 2010-09-21 LAB — TYPE AND SCREEN

## 2010-09-21 LAB — HEMOGLOBIN AND HEMATOCRIT, BLOOD
HCT: 37.4 % (ref 36.0–46.0)
HCT: 38 % (ref 36.0–46.0)
Hemoglobin: 11.9 g/dL — ABNORMAL LOW (ref 12.0–15.0)

## 2010-09-21 LAB — COMPREHENSIVE METABOLIC PANEL
ALT: 17 U/L (ref 0–35)
Albumin: 4 g/dL (ref 3.5–5.2)
Calcium: 8.8 mg/dL (ref 8.4–10.5)
GFR calc Af Amer: >60 mL/min (ref >60)
Glucose, Bld: 88 mg/dL (ref 70–99)
Total Protein: 6.5 g/dL (ref 6.0–8.3)

## 2010-09-21 LAB — TSH: TSH: 0.685 u[IU]/mL (ref 0.350–4.500)

## 2010-09-21 LAB — HEMOCCULT GUIAC POC 1CARD (OFFICE): Fecal Occult Bld: NEGATIVE

## 2010-09-21 LAB — URINE CULTURE: Colony Count: >=100000

## 2010-09-21 LAB — ABO/RH: ABO/RH(D): A POS

## 2010-09-23 LAB — URINALYSIS, ROUTINE W REFLEX MICROSCOPIC
Bilirubin Urine: NEGATIVE
Nitrite: POSITIVE — AB
Specific Gravity, Urine: 1.03 (ref 1.005–1.030)
pH: 5.5 (ref 5.0–8.0)

## 2010-09-23 LAB — DIFFERENTIAL
Lymphs Abs: 1 10*3/uL (ref 0.7–4.0)
Monocytes Relative: 6 % (ref 3–12)
Neutro Abs: 7.2 10*3/uL (ref 1.7–7.7)
Neutrophils Relative %: 82 % — ABNORMAL HIGH (ref 43–77)

## 2010-09-23 LAB — CBC
RBC: 4.34 MIL/uL (ref 3.87–5.11)
WBC: 8.9 10*3/uL (ref 4.0–10.5)

## 2010-09-23 LAB — URINE MICROSCOPIC-ADD ON

## 2010-09-23 LAB — POCT I-STAT, CHEM 8
BUN: 33 mg/dL — ABNORMAL HIGH (ref 6–23)
Chloride: 110 mEq/L (ref 96–112)
Creatinine, Ser: 1.8 mg/dL — ABNORMAL HIGH (ref 0.4–1.2)
Glucose, Bld: 83 mg/dL (ref 70–99)
Potassium: 3.3 mEq/L — ABNORMAL LOW (ref 3.5–5.1)

## 2010-10-26 NOTE — H&P (Signed)
NAMEMIRHA, BRUCATO NO.:  [id number]   MEDICAL RECORD NO.:  [id number]          PATIENT TYPE:  INP   LOCATION:  1533                         FACILITY:  [facility]   PHYSICIAN:  Thomasenia Bottoms, MDDATE OF BIRTH:  [birth year]   DATE OF ADMISSION:  06/28/2007  DATE OF DISCHARGE:                              HISTORY & PHYSICAL   CHIEF COMPLAINT:  Shortness of breath.   HISTORY OF PRESENTING ILLNESS:  Ms. Belasco is an 77 year old woman with  dementia sent in from the nursing home because of shortness of breath.  That history comes from the nursing home paperwork, the patient is  unable to give me any history herself.   PAST MEDICAL HISTORY:  Per the paperwork includes:  1. Hypothyroidism.  2. Dementia.  3. Depression/anxiety.  4. Chronic pain.  5. COPD.  6. History of pneumonia and UTI in the past.  7. History of hypoxia.   MEDICATIONS ON ARRIVAL INCLUDE:  1. Namenda 10 mg p.o. b.i.d.  2. Mucinex 600 mg p.o. b.i.d.  3. Aspirin 81 mg p.o. daily.  4. Potassium liquid every morning, the dose is not clear to me, she      takes 15 mL.  5. Alendronate 70 mg p.o. daily on Wednesday.  6. Levoxyl 50 mg p.o. daily.  7. Fexofenadine 180 mg p.o. daily.  8. Celebrex 200 mg p.o. daily.  9. She is on a regular diet with chopped meats and nectar-thick      liquids.  10.Aricept 10 mg p.o. at bedtime.  11.Reglan 5 mg per 5 mL, take 5 mL 3 times daily.  12.Carnation Instant Breakfast 1 can 3 times daily.  13.Calcium carbonate with vitamin D 600 mg 1 tablet twice a day.  14.Omeprazole 20 mg p.o. twice a day.  15.She was started on doxycycline 100 mg p.o. that was to be started      today, it is not clear that she has gotten any of that yet.   FAMILY HISTORY, SOCIAL HISTORY AND REVIEW OF SYSTEMS:  The patient is  not able to provide.   On arrival in the emergency department her temperature is 102.0, blood  pressure 111/52, pulse 119, respiratory rate 28, O2 sats is 95%  on 4  liters nasal cannula.   PHYSICAL EXAMINATION:  She is an elderly woman, pale, but in no acute  distress.  HEENT EXAM:  Normocephalic, atraumatic.  Her pupils are surgically  irregular bilaterally.  Her sclerae are anicteric.  Oral mucosa moist.  She wears dentures.  NECK:  Supple.  No lymphadenopathy, no thyromegaly, no jugular venous  distention.  CARDIAC EXAM:  Tachycardiac and regular.  LUNGS:  Reveal moderate expiratory wheezes in the bases bilaterally.  ABDOMEN:  Soft, nontender, nondistended.  Normoactive bowel sounds.  No  masses are appreciated.  EXTREMITIES:  Reveal no evidence of clubbing, cyanosis or edema but her  DP pulses are not palpable.  SKIN:  Warm and dry.  She has no open lesions or rashes.  Sacral area  was not examined.  MUSCULOSKELETAL:  Examination reveals no evidence of effusion of her  joints.  NEUROLOGICALLY:  The patient is awake and cooperative.  She has  no slurred speech.  Her cranial nerves II-XII are intact grossly.  She  is oriented to her name and to hospital.  She says it is 19.  She does  move each of her extremities to command, she can go from supine to  sitting without any assistance in the bed.   DATA:  The patient's white count is 29.5, hemoglobin 11.2, hematocrit  32.5, platelet count is 226.  Sodium is 144, potassium 4, chloride 108,  bicarb 27, glucose 161, BUN 32, creatinine 1.2, total protein is 5.8,  albumin is 3, AST is 18.  Her chest x-ray preliminarily reveals a  lingular infiltrate.   ASSESSMENT AND PLAN:  1. This is an 77 year old woman with pneumonia as evidenced by the      positive infiltrate on her chest x-ray, fever and elevated white      count.  Because she is coming from the nursing home, we will put      her on Zosyn and Cipro as well as vancomycin.  She will also      require O2.  She is currently on 4 liters nasal cannula to keep her      sats greater than 90%.  2. Mild chronic obstructive pulmonary disease  flare.  She has received      steroids and nebulizers in the emergency department.  We will      continue these as well.  3. Dementia.  We will continue her Namenda and Aricept.  4. For her hypothyroidism we will continue her Synthroid.   The patient will be put on DVT prophylaxis and GI prophylaxis.  She  already takes omeprazole which we will continue.  She is full code and  she will be monitored carefully.      Thomasenia Bottoms, MD  Electronically Signed     CVC/MEDQ  D:  06/28/2007  T:  06/29/2007  Job:  [id number]   cc:   [facility]

## 2010-10-26 NOTE — H&P (Signed)
NAMEJAILYNN, LAVALAIS NO.:  [id number]   MEDICAL RECORD NO.:  [id number]          PATIENT TYPE:  OBV   LOCATION:  0103                         FACILITY:  [facility]   PHYSICIAN:  Marcellus Scott, MD     DATE OF BIRTH:  02/21/1922   DATE OF ADMISSION:  11/04/2008  DATE OF DISCHARGE:                              HISTORY & PHYSICAL   PRIMARY MEDICAL DOCTOR:  Dr. Jacalyn Lefevre of [facility]   TRANSFERRING FACILITY:  [facility].   CHIEF COMPLAINT:  Rectal bleeding.   HISTORY OF PRESENTING ILLNESS:  Ms. Slatten is a pleasant 77 year old  Caucasian female patient with history of dementia and multiple other  medical problems who was transferred from the assisted living facility  with history of rectal bleeding.  Secondary to her pleasantly confused  mental state from her dementia, patient is unable to provide any  significant history.  The patient is aware that she is at the hospital  but does not know why she is here.  She says I want to go home.  She  denies any pain or bleeding.  In the emergency room, she has gotten out  of the gurney multiple times requesting to void urine.  She has  ambulated to the bathroom.  She denies any dysuria.   Per the emergency room staff notes, the patient was sent to the  emergency room secondary to rectal bleeding.  Per the EMS, nursing home  staff noticed bright red blood in the toilet this morning.  She does not  have a prior history of the same.  The patient denied any pain or  discomfort.  In the emergency room, a small amount of dried blood was  noted in the diaper.  She has not had any further bleeding in the  emergency room.  The patient as indicated denies nausea, vomiting,  abdominal pain.   PAST MEDICAL HISTORY:  1. Dementia.  2. Fall.  3. COPD, oxygen-dependent.  4. Anxiety.  5. Chronic pain.  6. Depression.  7. Hypothyroid.  8. Pneumonia.  9. Restless leg syndrome.  10.Hearing  impaired.  11.Urinary tract infection.  12.Hypoxia.  13.Hyperlipidemia.  14.Pleural effusions.   PAST SURGICAL HISTORY:  Unknown.   ALLERGIES:  NO KNOWN DRUG ALLERGIES.   MEDICATIONS:  1. Fosamax 70 mg p.o. weekly.  2. Potassium chloride 10% liquid, 15 mL with appropriate liquid p.o.      daily.  3. Allegra 180 mg p.o. daily.  4. Thick-It 8 ounces p.o. as directed.  5. Prilosec 20 mg p.o. daily.  6. Aspirin 81 mg p.o. daily.  7. Celebrex 200 mg p.o. daily.  8. Synthroid 37.5 mcg p.o. daily.  9. Nystatin cream to affected area b.i.d.  10.Namenda 10 mg p.o. b.i.d.  11.Caltrate 600 plus vitamin D p.o. b.i.d.  12.Mucinex 600 mg p.o. b.i.d.  13.Carnation Instant Breakfast one shake p.o. 3 times a day.  14.Xopenex 0.63 mg nebulizations t.i.d.  15.Reglan 5 mg p.o. t.i.d.  16.Aricept 10 mg p.o. at bedtime.  17.Lipitor 10 mg p.o. at bedtime.  18.Bactroban ointment to areas on  nose and forehead b.i.d.  19.Bacitracin ointment to tip of nose t.i.d.  20.Tylenol Extra Strength 500 mg p.o. q.i.d. p.r.n.  21.Imodium 2 mg p.o. as needed.  22.Oxygen 2 L per minute via nasal cannula continuously.   FAMILY HISTORY:  Not obtainable secondary to the patient's mental state.   SOCIAL HISTORY:  The patient is a resident of [facility].  The patient indicates she used to smoke in the past  but has quit.  She denies alcohol or drug abuse.  She is ambulatory.   ADVANCED DIRECTIVES:  The patient has a living will, healthcare proxy  and power of attorney.  Her code status, however, is not known per the  transfer sheets from the assisted living facility.   REVIEW OF SYSTEMS:  Comprehensive 12 systems' review done, but the  patient denies any complaints.   PHYSICAL EXAMINATION:  GENERAL:  Ms. Hogge is a moderately built and  nourished female patient in no obvious distress.  VITAL SIGNS:  Temperature 98.7, blood pressure 156/88 mmHg, pulse 67 per  minute, respiration  20 per minute, regular rate and rhythm, saturating  at 98%.  Head:  Head is nontraumatic, normocephalic.  Eyes:  Pupils bilaterally are constricted 1 mm reacting to light.  There  seems to be a left intraocular lens.  Right eye with arcus senilis.  ENT: Oral cavity with dentures but no evidence of blood or coffee  grounds.  NECK:  Supple.  No JVD or carotid bruit.  RESPIRATORY:  Clear to auscultation.  CARDIOVASCULAR:  First and second heart sounds heard.  Regular rate and  rhythm.  No murmurs.  ABDOMEN:  Nondistended, nontender.  No organomegaly or mass appreciated.  Bowel sounds are normally heard.  RECTAL EXAM:  Performed with nursing aide in the room.  Minimal trace  amount of dried blood on the diaper but no overt bleeding.  There is no  tenderness.  Soft brown stools on gloved finger which were sent for  occult blood testing.  No bright red bleeding on the gloved finger.  There are no hemorrhoids or fissures palpable.  No mass palpable.  CENTRAL NERVOUS SYSTEM:  The patient is awake, alert, oriented to person  and to place only.  She obeys commands appropriately.  There are no  focal neurological deficits.  EXTREMITIES:  No cyanosis, clubbing or edema.  Peripheral pulses are  symmetrically felt.  There are linear streaks of superficial abrasion on  both legs on the shins.  SKIN:  Without any other abnormalities.  MUSCULOSKELETAL SYSTEM:  Unremarkable.   LABORATORY DATA:  Fecal occult blood testing negative.  Comprehensive  metabolic panel is within normal limits.  INR is 1.1.  CBC with  hemoglobin 13.3, hematocrit 37.7, white blood cell 6.3, platelets 204.  Urinalysis with 3-6 white blood cells per high-power field and many  bacteria.   EKG which is normal sinus rhythm at 63 beats per minute, normal axis.  There are no acute ischemic changes.  Nonspecific T-wave abnormalities.   ASSESSMENT AND PLAN:  1. Rectal bleed/bright red bleeding per rectum.  The exact amount and       details are unknown.  Probably, diverticular versus hemorrhoidal,      although rectal exam is negative for such.  The patient is      hemodynamically stable.  Will admit the patient to medical floor      for 23-hour observation.  Will place two large bore IV accesses.  Will check hemoglobin and hematocrits q.8 h. for 24 hours and      continue on current diet.  If there is any further bleeding, will      consider gastroenterology consult.  If not, the patient may be      discharged back to the nursing facility tomorrow to obtain      outpatient gastroenterology evaluation especially if she has not      had one recently including a colonoscopy.  2. Urinary tract infection as evidenced by urinalysis and the      patient's frequency of urination.  Will send urine for culture and      treat empirically with oral ciprofloxacin.  Unsure if this is      recurrent urinary tract infection.  Aim to treat for 1 week.  3. Hypothyroidism.  Continue home Synthroid.  4. Oxygen-dependent chronic obstructive pulmonary disease.  Continue      home O2.  5. Dementia, probably at baseline.  6. Elevated blood pressure is questioned secondary to stress but will      monitor without medications.  7. Attempted to get in touch with the patient's family.  Called Ms.      Alethia Berthold, patient's daughter, but was unable to get her at the      phone numbers available.  We also called Rosaura Carpenter and left a      message at [phone] for him to get in touch with [place] so that we can      update the patient's care.   Time taken coordinating this admission is 1 hour.      Marcellus Scott, MD  Electronically Signed     AH/MEDQ  D:  11/04/2008  T:  11/04/2008  Job:  [id number]   cc:   Frederica Kuster, MD  Fax: [phone]

## 2010-10-26 NOTE — Discharge Summary (Signed)
Erin Davis, Erin Davis NO.:  [id number]   MEDICAL RECORD NO.:  [id number]          PATIENT TYPE:  INP   LOCATION:  1533                         FACILITY:  [facility]   PHYSICIAN:  Michaelyn Barter, M.D. DATE OF BIRTH:  11-03-1921   DATE OF ADMISSION:  06/28/2007  DATE OF DISCHARGE:  07/07/2007                               DISCHARGE SUMMARY   PRIMARY CARE DOCTOR:  [facility].   FINAL DIAGNOSES:  1. Pneumonia.  2. Chronic obstructive pulmonary disease exacerbation.  3. Pleural effusions.   PROCEDURES:  Portable chest x-ray completed on January 15, January 22,  and January 23.   HISTORY OF PRESENT ILLNESS:  Erin Davis is an 77 year old female who  happens to be a nursing home patient.  She developed shortness of breath  and was sent to the hospital for evaluation.  For past medical history,  please see that dictated by Dr. Buena Irish.   HOSPITAL COURSE:  1. Pneumonia.  A portable chest x-ray was done on January 15.  It      revealed lingular infiltrate without significant change, mild right      mid lung atelectasis versus scarring also was noted.  The patient      was started on empiric IV Zosyn as well as ciprofloxacin.  Over the      course of her hospitalization, her breathing improved.  2. Acute chronic obstructive pulmonary disease exacerbation.  Empiric      antibiotics were started.  The patient was treated with IV steroids      which were switched to oral prednisone as well as nebulized      breathing treatments.  The patient's breathing has improved and      over the past 3 days, she has not complained of shortness of      breath.  3. Pleural effusions.  A portable chest x-ray was completed on January      22 which revealed increased bilateral pleural effusions and basilar      airspace disease.  IV Lasix was provided.  A followup chest x-ray      done on January 23 revealed congestive heart failure.  Airspace      disease to the lung  bases was also noted to be improved.  Over the      last 2 days of the patient's hospitalization, she has indicated      that her breathing has improved.  4. Renal insufficiency.  At the time of the patient's admission her      BUN was 32 with a creatinine of 1.20.  There has been some slight      increase in these parameters during this hospitalization.  On the      date of discharge, the patient's BUN was 18; however, creatinine      was 1.36.  Lasix may have contributed to this.  This can simply be      monitored after the patient is discharged.   Condition at the time of discharge appears to be stable.  The patient's  temperature is 98.7.  Her heart rate  79, respirations 20, blood pressure  135/68.  O2 saturation is 92% on room air.  The patient denies having  any shortness of breath, and she appears to be comfortable.  The patient  will be discharged back to the nursing facility on:  1. Aspirin 81 mg p.o. daily.  2. Calcium carbonate 1 tab p.o. b.i.d.  3. Aricept 10 mg p.o. q.h.s.  4. Mucinex 600 mg p.o. b.i.d.  5. Xopenex 0.63 mg via nebulizer q.8h.  6. Levothyroxine 50 mcg p.o. daily.  7. Reglan 5 mg p.o. t.i.d.  8. Protonix 40 mg p.o. b.i.d.  9. Moxifloxacin 400 mg p.o. daily.  10.The patient should take an additional 4 days of moxifloxacin.  11.Potassium chloride 15 mEq p.o. daily.  12.Prednisone.  The patient should take 20 mg of prednisone today,      January 24.  She should take 10 mg of prednisone tomorrow, January      25, and then she should stop.      Michaelyn Barter, M.D.  Electronically Signed     OR/MEDQ  D:  07/07/2007  T:  07/07/2007  Job:  [id number]

## 2010-10-26 NOTE — Discharge Summary (Signed)
Erin Davis, Erin Davis NO.:  [id number]   MEDICAL RECORD NO.:  [id number]          PATIENT TYPE:  OBV   LOCATION:  1501                         FACILITY:  [facility]   PHYSICIAN:  Theodosia Paling, MD    DATE OF BIRTH:  Jun 04, 1922   DATE OF ADMISSION:  11/04/2008  DATE OF DISCHARGE:                               DISCHARGE SUMMARY   PRIMARY CARE PHYSICIAN:  Dr. Jacalyn Lefevre of [facility]   ADMITTING HISTORY:  Please refer to the  admission note   DISCHARGE DIAGNOSES:  1. Gastrointestinal bleed, bright red blood per rectum, one episode.  2. Urinary tract infection.   SECONDARY DIAGNOSES:  1. History of dementia.  2. History of chronic obstructive pulmonary disease, oxygen-dependent.  3. History of anxiety.  4. History of depression.  5. History of hypothyroidism.  6. History of hyperlipidemia.   DISCHARGE MEDICATIONS:  New medication added:  Ciprofloxacin 500 mg  daily 12 hours.   Home medications - the patient is to continue home medication which is  as follows:  1. Fosamax 70 mg p.o. weekly.  2. KCl 10% liquid, 15 mL in appropriate liquid p.o. daily.  3. Allegra 180 mg p.o. daily.  4. Food thickener 8 ounces p.o. as directed.  5. Prilosec 20 mg p.o. daily.  6. Aspirin 81 mg p.o. daily.  7. Celebrex 200 mg p.o. daily.  8. Synthroid 37.5 mcg p.o. daily.  9. Nystatin cream to the affected area b.i.d.  10.Namenda 10 mg p.o. q.12 hours.  11.Caltrate 600 plus vitamin D p.o. q.12 hours.  12.Mucinex 600 mg p.o. q.12 hours.  13.Carnation Instant Breakfast 1 shake p.o. q.8 hours.  14.Xopenex 0.63 mg nebulizer q.8 hours.  15.Reglan 5 mg p.o. q.8 hours.  16.Aricept 10 mg p.o. q.h.s.  17.Lipitor 10 mg p.o. q.h.s.  18.Bactroban ointment to the areas on nose and forehead twice a day.  19.Bacitracin ointment to tip of the nose 3 times a day.  20.Tylenol Extra Strength 500 mg p.o. q.6 hours p.r.n.  21.Imodium 2 mg p.o. daily as needed.  22.Oxygen 2 liters  nasal cannula continuously.   HOSPITAL COURSE:  Following issues were addressed during the  hospitalization:  1. Bright red rectal bleeding.  The patient was admitted.  H and H was      followed.  The patient stayed hemodynamically stable, had no      further GI bleed.  Blood pressures stayed stable.  No drop in      hemoglobin was noticed.  At the time of discharge, the patient is      asymptomatic.  If she has persistent complaints, she can undergo GI      evaluation in future.  2. UTI.  The patient had a UA suggestive of UTI.  She will complete a      5-day course of ciprofloxacin.  3. Hypothyroidism.  Synthroid is to be continued.  4. COPD remained stable.  Home oxygen was continued.  5. Dementia.  It was at baseline.   DISPOSITION:  The patient is going back to the facility.   Total time spent in  discharge of this patient was 45 minutes.   IMAGING PERFORMED:  None.   PROCEDURE PERFORMED:  None.   CONSULTATION PERFORMED:  None.      Theodosia Paling, MD  Electronically Signed     NP/MEDQ  D:  11/05/2008  T:  11/05/2008  Job:  [id number]   cc:   Bertram Millard. Hyacinth Meeker, M.D.  Fax: [phone]

## 2010-12-07 ENCOUNTER — Emergency Department (HOSPITAL_BASED_OUTPATIENT_CLINIC_OR_DEPARTMENT_OTHER)
Admission: EM | Admit: 2010-12-07 | Discharge: 2010-12-07 | Disposition: A | Discharge status: Home / Self Care | Payer: Medicare Other | Attending: Emergency Medicine | Admitting: Emergency Medicine

## 2010-12-07 ENCOUNTER — Emergency Department (INDEPENDENT_AMBULATORY_CARE_PROVIDER_SITE_OTHER): Payer: Medicare Other

## 2010-12-07 DIAGNOSIS — J449 Chronic obstructive pulmonary disease, unspecified: Secondary | ICD-10-CM | POA: Insufficient documentation

## 2010-12-07 DIAGNOSIS — R0602 Shortness of breath: Secondary | ICD-10-CM

## 2010-12-07 DIAGNOSIS — E039 Hypothyroidism, unspecified: Secondary | ICD-10-CM | POA: Insufficient documentation

## 2010-12-07 DIAGNOSIS — J4489 Other specified chronic obstructive pulmonary disease: Secondary | ICD-10-CM | POA: Insufficient documentation

## 2010-12-07 DIAGNOSIS — I517 Cardiomegaly: Secondary | ICD-10-CM

## 2010-12-07 DIAGNOSIS — G8929 Other chronic pain: Secondary | ICD-10-CM | POA: Insufficient documentation

## 2010-12-07 DIAGNOSIS — F3289 Other specified depressive episodes: Secondary | ICD-10-CM | POA: Insufficient documentation

## 2010-12-07 DIAGNOSIS — Z79899 Other long term (current) drug therapy: Secondary | ICD-10-CM | POA: Insufficient documentation

## 2010-12-07 DIAGNOSIS — F329 Major depressive disorder, single episode, unspecified: Secondary | ICD-10-CM | POA: Insufficient documentation

## 2010-12-07 LAB — TROPONIN I: Troponin I: <0.3 ng/mL (ref <0.30)

## 2010-12-07 LAB — BASIC METABOLIC PANEL
BUN: 17 mg/dL (ref 6–23)
CO2: 29 mEq/L (ref 19–32)
Calcium: 9.7 mg/dL (ref 8.4–10.5)
Chloride: 103 mEq/L (ref 96–112)
Creatinine, Ser: 0.7 mg/dL (ref 0.50–1.10)
GFR calc Af Amer: >60 mL/min (ref >60)

## 2010-12-07 LAB — CBC
HCT: 37.3 % (ref 36.0–46.0)
Hemoglobin: 12.5 g/dL (ref 12.0–15.0)
MCH: 30.7 pg (ref 26.0–34.0)
MCHC: 33.5 g/dL (ref 30.0–36.0)
MCV: 91.6 fL (ref 78.0–100.0)
Platelets: 240 10*3/uL (ref 150–400)
RBC: 4.07 MIL/uL (ref 3.87–5.11)
RDW: 13.6 % (ref 11.5–15.5)
WBC: 12.7 10*3/uL — ABNORMAL HIGH (ref 4.0–10.5)

## 2010-12-07 LAB — CK TOTAL AND CKMB (NOT AT ARMC)
Relative Index: 3 — ABNORMAL HIGH (ref 0.0–2.5)
Total CK: 168 U/L (ref 7–177)

## 2010-12-07 LAB — DIFFERENTIAL
Eosinophils Absolute: 0.2 10*3/uL (ref 0.0–0.7)
Eosinophils Relative: 1 % (ref 0–5)
Lymphs Abs: 3.6 10*3/uL (ref 0.7–4.0)

## 2010-12-08 ENCOUNTER — Emergency Department (HOSPITAL_BASED_OUTPATIENT_CLINIC_OR_DEPARTMENT_OTHER)
Admission: EM | Admit: 2010-12-08 | Discharge: 2010-12-08 | Disposition: A | Discharge status: Home / Self Care | Payer: Medicare Other | Attending: Emergency Medicine | Admitting: Emergency Medicine

## 2010-12-08 ENCOUNTER — Emergency Department (INDEPENDENT_AMBULATORY_CARE_PROVIDER_SITE_OTHER): Payer: Medicare Other

## 2010-12-08 DIAGNOSIS — R05 Cough: Secondary | ICD-10-CM

## 2010-12-08 DIAGNOSIS — G8929 Other chronic pain: Secondary | ICD-10-CM | POA: Insufficient documentation

## 2010-12-08 DIAGNOSIS — E039 Hypothyroidism, unspecified: Secondary | ICD-10-CM | POA: Insufficient documentation

## 2010-12-08 DIAGNOSIS — Z79899 Other long term (current) drug therapy: Secondary | ICD-10-CM | POA: Insufficient documentation

## 2010-12-08 DIAGNOSIS — J40 Bronchitis, not specified as acute or chronic: Secondary | ICD-10-CM | POA: Insufficient documentation

## 2010-12-08 DIAGNOSIS — F0390 Unspecified dementia without behavioral disturbance: Secondary | ICD-10-CM | POA: Insufficient documentation

## 2010-12-08 DIAGNOSIS — J4489 Other specified chronic obstructive pulmonary disease: Secondary | ICD-10-CM | POA: Insufficient documentation

## 2010-12-08 DIAGNOSIS — J449 Chronic obstructive pulmonary disease, unspecified: Secondary | ICD-10-CM | POA: Insufficient documentation

## 2010-12-08 LAB — BASIC METABOLIC PANEL
BUN: 22 mg/dL (ref 6–23)
CO2: 26 mEq/L (ref 19–32)
Chloride: 104 mEq/L (ref 96–112)
Creatinine, Ser: 0.8 mg/dL (ref 0.50–1.10)
Glucose, Bld: 114 mg/dL — ABNORMAL HIGH (ref 70–99)

## 2010-12-08 LAB — BASIC METABOLIC PANEL WITH GFR
Calcium: 10 mg/dL (ref 8.4–10.5)
GFR calc Af Amer: >60 mL/min (ref >60)
GFR calc non Af Amer: >60 mL/min (ref >60)
Potassium: 3.7 meq/L (ref 3.5–5.1)
Sodium: 143 meq/L (ref 135–145)

## 2010-12-08 LAB — DIFFERENTIAL
Basophils Absolute: 0 K/uL (ref 0.0–0.1)
Basophils Relative: 0 % (ref 0–1)
Eosinophils Absolute: 0 K/uL (ref 0.0–0.7)
Eosinophils Relative: 0 % (ref 0–5)
Lymphocytes Relative: 5 % — ABNORMAL LOW (ref 12–46)
Lymphs Abs: 0.8 10*3/uL (ref 0.7–4.0)
Monocytes Absolute: 1 K/uL (ref 0.1–1.0)
Monocytes Relative: 6 % (ref 3–12)
Neutro Abs: 15 K/uL — ABNORMAL HIGH (ref 1.7–7.7)
Neutrophils Relative %: 89 % — ABNORMAL HIGH (ref 43–77)

## 2010-12-08 LAB — CBC
HCT: 35.4 % — ABNORMAL LOW (ref 36.0–46.0)
Hemoglobin: 11.9 g/dL — ABNORMAL LOW (ref 12.0–15.0)
MCH: 30.5 pg (ref 26.0–34.0)
MCHC: 33.6 g/dL (ref 30.0–36.0)
MCV: 90.8 fL (ref 78.0–100.0)
Platelets: 248 K/uL (ref 150–400)
RBC: 3.9 MIL/uL (ref 3.87–5.11)
RDW: 13.8 % (ref 11.5–15.5)
WBC: 16.9 10*3/uL — ABNORMAL HIGH (ref 4.0–10.5)

## 2011-01-07 ENCOUNTER — Emergency Department (INDEPENDENT_AMBULATORY_CARE_PROVIDER_SITE_OTHER): Payer: Medicare Other

## 2011-01-07 ENCOUNTER — Emergency Department (HOSPITAL_BASED_OUTPATIENT_CLINIC_OR_DEPARTMENT_OTHER)
Admission: EM | Admit: 2011-01-07 | Discharge: 2011-01-07 | Disposition: A | Discharge status: Other Acute Inpatient Hospital | Payer: Medicare Other | Attending: Emergency Medicine | Admitting: Emergency Medicine

## 2011-01-07 ENCOUNTER — Encounter: Payer: Self-pay | Admitting: *Deleted

## 2011-01-07 DIAGNOSIS — R059 Cough, unspecified: Secondary | ICD-10-CM

## 2011-01-07 DIAGNOSIS — R05 Cough: Secondary | ICD-10-CM

## 2011-01-07 DIAGNOSIS — M899 Disorder of bone, unspecified: Secondary | ICD-10-CM

## 2011-01-07 DIAGNOSIS — F0280 Dementia in other diseases classified elsewhere without behavioral disturbance: Secondary | ICD-10-CM | POA: Insufficient documentation

## 2011-01-07 DIAGNOSIS — I517 Cardiomegaly: Secondary | ICD-10-CM

## 2011-01-07 DIAGNOSIS — J4489 Other specified chronic obstructive pulmonary disease: Secondary | ICD-10-CM | POA: Insufficient documentation

## 2011-01-07 DIAGNOSIS — G309 Alzheimer's disease, unspecified: Secondary | ICD-10-CM | POA: Insufficient documentation

## 2011-01-07 DIAGNOSIS — J449 Chronic obstructive pulmonary disease, unspecified: Secondary | ICD-10-CM

## 2011-01-07 DIAGNOSIS — E039 Hypothyroidism, unspecified: Secondary | ICD-10-CM | POA: Insufficient documentation

## 2011-01-07 DIAGNOSIS — G8929 Other chronic pain: Secondary | ICD-10-CM | POA: Insufficient documentation

## 2011-01-07 DIAGNOSIS — Z79899 Other long term (current) drug therapy: Secondary | ICD-10-CM | POA: Insufficient documentation

## 2011-01-07 DIAGNOSIS — R0602 Shortness of breath: Secondary | ICD-10-CM | POA: Insufficient documentation

## 2011-01-07 DIAGNOSIS — R1310 Dysphagia, unspecified: Secondary | ICD-10-CM | POA: Insufficient documentation

## 2011-01-07 HISTORY — DX: Unspecified dementia, unspecified severity, without behavioral disturbance, psychotic disturbance, mood disturbance, and anxiety: F03.90

## 2011-01-07 HISTORY — DX: Hypothyroidism, unspecified: E03.9

## 2011-01-07 HISTORY — DX: Other chronic pain: G89.29

## 2011-01-07 HISTORY — DX: Major depressive disorder, single episode, unspecified: F32.9

## 2011-01-07 HISTORY — DX: Dementia in other diseases classified elsewhere, unspecified severity, without behavioral disturbance, psychotic disturbance, mood disturbance, and anxiety: F02.80

## 2011-01-07 HISTORY — DX: Depression, unspecified: F32.A

## 2011-01-07 HISTORY — DX: Anxiety disorder, unspecified: F41.9

## 2011-01-07 HISTORY — DX: Restless legs syndrome: G25.81

## 2011-01-07 HISTORY — DX: Alzheimer's disease, unspecified: G30.9

## 2011-01-07 HISTORY — DX: Chronic obstructive pulmonary disease, unspecified: J44.9

## 2011-01-07 LAB — DIFFERENTIAL
Basophils Absolute: 0 10*3/uL (ref 0.0–0.1)
Eosinophils Relative: 3 % (ref 0–5)
Lymphocytes Relative: 18 % (ref 12–46)
Monocytes Absolute: 0.6 10*3/uL (ref 0.1–1.0)

## 2011-01-07 LAB — CBC
HCT: 37.9 % (ref 36.0–46.0)
MCHC: 34.8 g/dL (ref 30.0–36.0)
MCV: 91.3 fL (ref 78.0–100.0)
RDW: 13.8 % (ref 11.5–15.5)
WBC: 6.7 10*3/uL (ref 4.0–10.5)

## 2011-01-07 LAB — BASIC METABOLIC PANEL
CO2: 28 mEq/L (ref 19–32)
Calcium: 10 mg/dL (ref 8.4–10.5)
Creatinine, Ser: 0.9 mg/dL (ref 0.50–1.10)

## 2011-01-07 NOTE — ED Notes (Signed)
Pt is awake and alert, no cough noted.  Pt is pleasantly confused to baseline

## 2011-01-07 NOTE — ED Provider Notes (Signed)
History     Chief Complaint  Patient presents with  . Cough  . Shortness of Breath   Patient is a 77 y.o. female presenting with shortness of breath. The history is provided by the nursing home. History Limited By: History is limited by dementia.  Shortness of Breath  The current episode started today. The problem occurs occasionally. The problem has been unchanged. The problem is severe. Associated symptoms include cough and shortness of breath. Pertinent negatives include no fever.  She was noted to have difficulty swallowing at the nursing home. She would cough, and SaO2 dropped to the 70's. Symptoms were only present when she tried to swallow. When asked, the patient did not know why she was here.  Past Medical History  Diagnosis Date  . Alzheimer disease   . Dementia   . Hypothyroid   . COPD (chronic obstructive pulmonary disease)   . Depression   . Anxiety   . Restless leg syndrome   . Chronic pain     History reviewed. No pertinent past surgical history.  History reviewed. No pertinent family history.  History  Substance Use Topics  . Smoking status: Former Games developer  . Smokeless tobacco: Not on file  . Alcohol Use: No    OB History    Grav Para Term Preterm Abortions TAB SAB Ect Mult Living                  Review of Systems  Unable to perform ROS: Dementia  Constitutional: Negative for fever.  Respiratory: Positive for cough and shortness of breath.     Physical Exam  BP 123/75  Pulse 79  Temp(Src) 98.3 F (36.8 C) (Oral)  Resp 20  SpO2 98%  Physical Exam  Nursing note and vitals reviewed. Constitutional: She appears well-developed and well-nourished. No distress.  HENT:  Head: Normocephalic and atraumatic.  Right Ear: External ear normal.  Left Ear: External ear normal.  Mouth/Throat: Oropharynx is clear and moist.  Eyes: EOM are normal. Pupils are equal, round, and reactive to light. No scleral icterus.  Neck: Normal range of motion. Neck  supple. No JVD present.  Cardiovascular: Normal rate, regular rhythm and normal heart sounds.   No murmur heard. Pulmonary/Chest: Effort normal and breath sounds normal. She has no wheezes. She has no rales.  Abdominal: Soft. Bowel sounds are normal. She exhibits no mass. There is no tenderness.  Musculoskeletal: Normal range of motion. She exhibits no edema.  Lymphadenopathy:    She has no cervical adenopathy.  Neurological: She is alert. No cranial nerve deficit. Coordination normal.  Skin: Skin is warm and dry. No rash noted.  Psychiatric: She has a normal mood and affect.    ED Course  Procedures Swallow screen passed. MDM Description of events at nursing home are most suggestive of aspiration. She will need dietary adjustment.      Dione Booze, MD 01/07/11 [phone]

## 2011-01-07 NOTE — ED Notes (Signed)
Per ems, pt was found sitting down on the floor at [facility] "having a coughing spell".  ems reports sats stable en route, no cough for ems during transport.

## 2011-03-03 LAB — CBC
HCT: 29.6 — ABNORMAL LOW
HCT: 32.5 — ABNORMAL LOW
Hemoglobin: 10.3 — ABNORMAL LOW
Hemoglobin: 11.2 — ABNORMAL LOW
MCHC: 34
MCHC: 34.2
MCHC: 34.4
MCHC: 34.9
MCV: 92
MCV: 92.3
MCV: 92.5
Platelets: 218
Platelets: 248
Platelets: 356
Platelets: 367
RBC: 3.2 — ABNORMAL LOW
RBC: 3.53 — ABNORMAL LOW
RBC: 3.74 — ABNORMAL LOW
RDW: 13.3
RDW: 13.7
WBC: 12.4 — ABNORMAL HIGH
WBC: 15.6 — ABNORMAL HIGH
WBC: 16.7 — ABNORMAL HIGH
WBC: 25.5 — ABNORMAL HIGH
WBC: 29.5 — ABNORMAL HIGH

## 2011-03-03 LAB — URINALYSIS, MICROSCOPIC ONLY
Ketones, ur: NEGATIVE
Leukocytes, UA: NEGATIVE
Nitrite: NEGATIVE
Specific Gravity, Urine: 1.019
pH: 6

## 2011-03-03 LAB — COMPREHENSIVE METABOLIC PANEL
ALT: 12
AST: 18
AST: 18
Albumin: 2.7 — ABNORMAL LOW
Alkaline Phosphatase: 72
BUN: 32 — ABNORMAL HIGH
BUN: 33 — ABNORMAL HIGH
CO2: 27
CO2: 28
Calcium: 9
Chloride: 107
Chloride: 108
Creatinine, Ser: 1.2
Creatinine, Ser: 1.28 — ABNORMAL HIGH
GFR calc Af Amer: 48 — ABNORMAL LOW
GFR calc non Af Amer: 40 — ABNORMAL LOW
GFR calc non Af Amer: 43 — ABNORMAL LOW
Glucose, Bld: 161 — ABNORMAL HIGH
Glucose, Bld: 259 — ABNORMAL HIGH
Potassium: 4.2
Sodium: 142
Total Bilirubin: 0.6
Total Bilirubin: 0.7
Total Protein: 5.3 — ABNORMAL LOW

## 2011-03-03 LAB — BLOOD GAS, ARTERIAL
O2 Content: 2
O2 Saturation: 95.9
pO2, Arterial: 85.8

## 2011-03-03 LAB — URINALYSIS, ROUTINE W REFLEX MICROSCOPIC
Nitrite: NEGATIVE
Urobilinogen, UA: 0.2
pH: 5.5

## 2011-03-03 LAB — CLOSTRIDIUM DIFFICILE EIA: C difficile Toxins A+B, EIA: NEGATIVE

## 2011-03-03 LAB — B-NATRIURETIC PEPTIDE (CONVERTED LAB)
Pro B Natriuretic peptide (BNP): 108 — ABNORMAL HIGH
Pro B Natriuretic peptide (BNP): 69.6

## 2011-03-03 LAB — BASIC METABOLIC PANEL
BUN: 14
BUN: 18
BUN: 25 — ABNORMAL HIGH
Calcium: 8.7
Calcium: 8.7
Chloride: 109
Chloride: 111
Creatinine, Ser: 1.07
Creatinine, Ser: 1.22 — ABNORMAL HIGH
Creatinine, Ser: 1.29 — ABNORMAL HIGH
Creatinine, Ser: 1.36 — ABNORMAL HIGH
GFR calc Af Amer: 59 — ABNORMAL LOW
GFR calc non Af Amer: 37 — ABNORMAL LOW
GFR calc non Af Amer: 39 — ABNORMAL LOW
Glucose, Bld: 101 — ABNORMAL HIGH
Sodium: 141

## 2011-03-03 LAB — DIFFERENTIAL
Eosinophils Relative: 0
Lymphs Abs: 0.9
Monocytes Absolute: 2.1 — ABNORMAL HIGH
Monocytes Relative: 7
Neutro Abs: 26.5 — ABNORMAL HIGH

## 2011-03-03 LAB — URINE MICROSCOPIC-ADD ON

## 2011-03-03 LAB — URINE CULTURE

## 2011-03-03 LAB — CULTURE, BLOOD (ROUTINE X 2)

## 2011-03-03 LAB — OCCULT BLOOD (STOOL CUP TO LAB)

## 2011-03-03 LAB — TSH: TSH: 0.399

## 2011-03-04 LAB — BASIC METABOLIC PANEL WITH GFR
CO2: 30
GFR calc non Af Amer: 53 — ABNORMAL LOW
Glucose, Bld: 93
Potassium: 4.2
Sodium: 145

## 2011-03-04 LAB — URINE MICROSCOPIC-ADD ON

## 2011-03-04 LAB — URINALYSIS, ROUTINE W REFLEX MICROSCOPIC
Bilirubin Urine: NEGATIVE
Glucose, UA: NEGATIVE
Hgb urine dipstick: NEGATIVE
Ketones, ur: NEGATIVE
Nitrite: NEGATIVE
Protein, ur: NEGATIVE
Specific Gravity, Urine: 1.026
Urobilinogen, UA: 0.2
pH: 7

## 2011-03-04 LAB — BASIC METABOLIC PANEL
BUN: 37 — ABNORMAL HIGH
Calcium: 9
Chloride: 112
Creatinine, Ser: 1
GFR calc Af Amer: >60

## 2011-03-04 LAB — CLOSTRIDIUM DIFFICILE EIA: C difficile Toxins A+B, EIA: 20

## 2011-03-04 LAB — DIFFERENTIAL
Basophils Absolute: 0
Basophils Relative: 0
Eosinophils Absolute: 0.3
Eosinophils Relative: 2
Lymphocytes Relative: 12
Lymphs Abs: 1.5
Monocytes Absolute: 1.5 — ABNORMAL HIGH
Monocytes Relative: 11
Neutro Abs: 9.6 — ABNORMAL HIGH
Neutrophils Relative %: 75

## 2011-03-04 LAB — STOOL CULTURE

## 2011-03-04 LAB — CBC
HCT: 33.8 — ABNORMAL LOW
Hemoglobin: 11.6 — ABNORMAL LOW
MCHC: 34.4
MCV: 91.7
Platelets: 255
RBC: 3.69 — ABNORMAL LOW
RDW: 13.8
WBC: 12.9 — ABNORMAL HIGH

## 2011-03-04 LAB — OVA AND PARASITE EXAMINATION

## 2011-03-18 LAB — BASIC METABOLIC PANEL
Calcium: 9.2 mg/dL (ref 8.4–10.5)
GFR calc Af Amer: 55 mL/min — ABNORMAL LOW (ref >60)
GFR calc non Af Amer: 45 mL/min — ABNORMAL LOW (ref >60)
Potassium: 4.1 mEq/L (ref 3.5–5.1)
Sodium: 144 mEq/L (ref 135–145)

## 2011-03-24 LAB — BASIC METABOLIC PANEL
BUN: 19
CO2: 34 — ABNORMAL HIGH
Chloride: 104
Potassium: 4.3

## 2011-03-24 LAB — CBC
HCT: 36.8
Hemoglobin: 12.1
Platelets: 212
RBC: 3.78 — ABNORMAL LOW
WBC: 6.1

## 2011-03-24 LAB — DIFFERENTIAL
Eosinophils Relative: 4
Lymphocytes Relative: 30
Lymphs Abs: 1.8
Monocytes Relative: 13 — ABNORMAL HIGH

## 2011-03-24 LAB — URINALYSIS, ROUTINE W REFLEX MICROSCOPIC
Nitrite: NEGATIVE
Specific Gravity, Urine: 1.021
Urobilinogen, UA: 0.2
pH: 6

## 2011-03-24 LAB — RAPID URINE DRUG SCREEN, HOSP PERFORMED: Barbiturates: NOT DETECTED

## 2011-03-24 LAB — URINE MICROSCOPIC-ADD ON

## 2011-03-24 LAB — ETHANOL: Alcohol, Ethyl (B): <5

## 2011-03-24 LAB — TRICYCLICS SCREEN, URINE: TCA Scrn: NOT DETECTED

## 2011-03-25 LAB — I-STAT 8, (EC8 V) (CONVERTED LAB)
BUN: 20
Bicarbonate: 27.9 — ABNORMAL HIGH
Glucose, Bld: 99
pCO2, Ven: 45

## 2011-03-25 LAB — DIFFERENTIAL
Eosinophils Absolute: 0.3
Eosinophils Relative: 9 — ABNORMAL HIGH
Lymphs Abs: 1.1
Monocytes Relative: 16 — ABNORMAL HIGH

## 2011-03-25 LAB — URINALYSIS, ROUTINE W REFLEX MICROSCOPIC
Nitrite: NEGATIVE
Specific Gravity, Urine: 1.021
pH: 6

## 2011-03-25 LAB — CBC
HCT: 35.7 — ABNORMAL LOW
MCV: 95.4
Platelets: 178
RBC: 3.74 — ABNORMAL LOW
WBC: 3.9 — ABNORMAL LOW

## 2011-03-25 LAB — POCT I-STAT CREATININE: Creatinine, Ser: 1.6 — ABNORMAL HIGH

## 2011-09-14 IMAGING — CT CT CHEST W/O CM
2 of 3 series · 15 of 36 positions shown, 18 images · non-contrast
Comparison: Abdomen CT on 05/08/2009

CLINICAL DATA: Left lung nodule seen on recent CT.

CT CHEST WITHOUT CONTRAST
TECHNIQUE: Multidetector CT imaging of the chest was performed
following the standard protocol without IV contrast.

[Series 2: chest_routine 5.0 b40f st · axial · 0.58mm/px · z∈[-312,-62]mm · 12 of 60 slices shown, 15 images]
[im 5/60  mediastinal]
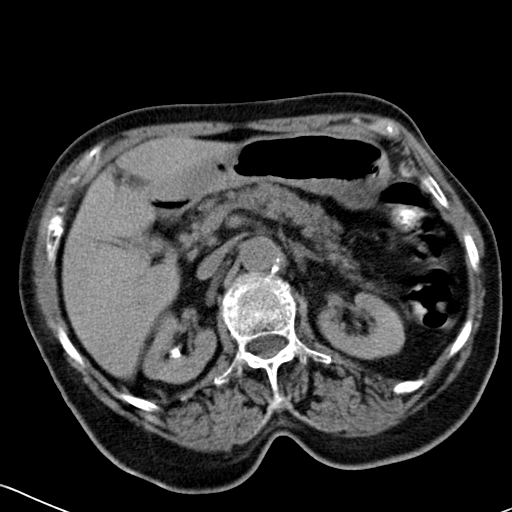
[im 5/60  lung]
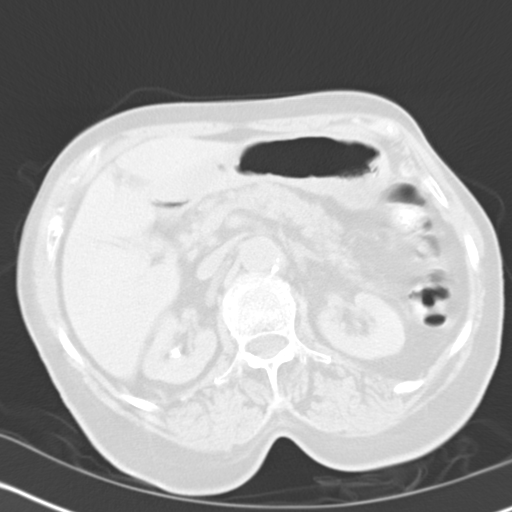
[im 9/60  lung]
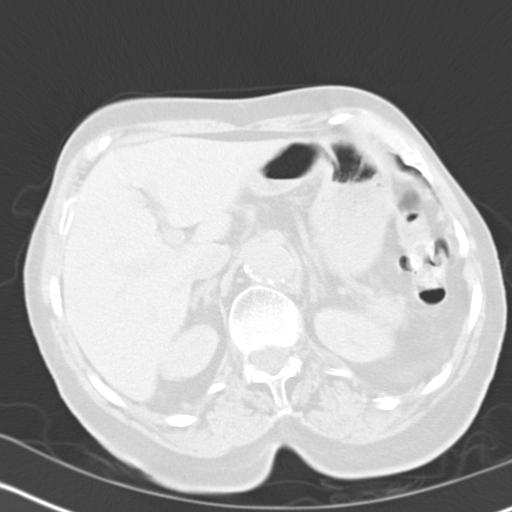
[im 14/60  lung]
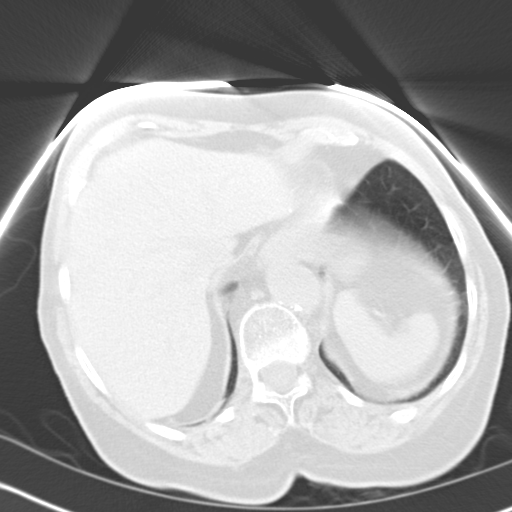
[im 18/60  lung]
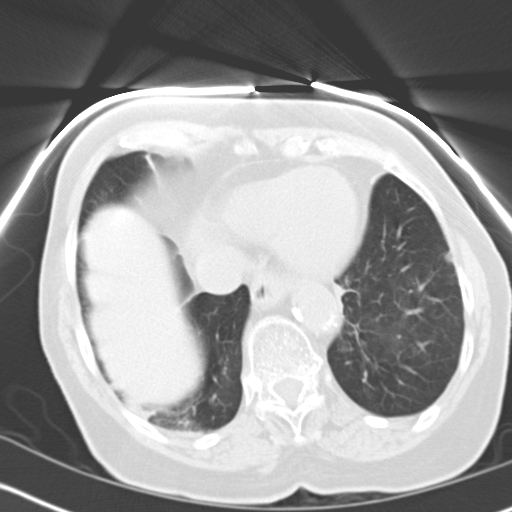
[im 22/60  mediastinal]
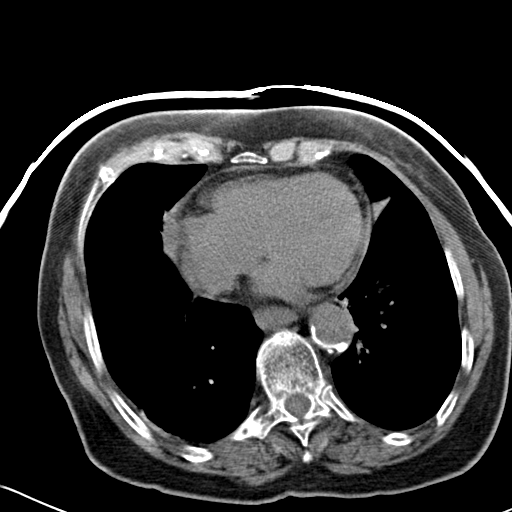
[im 22/60  lung]
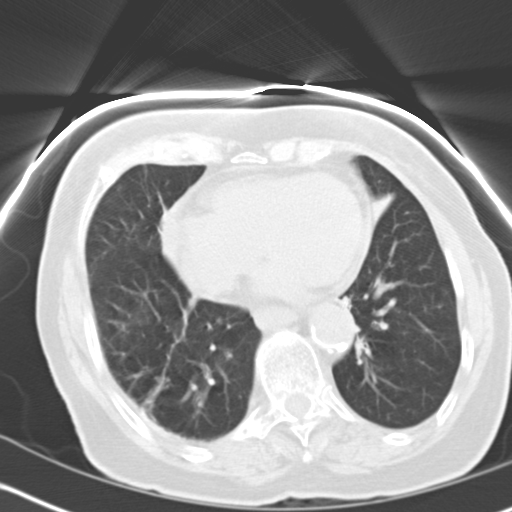
[im 27/60  lung]
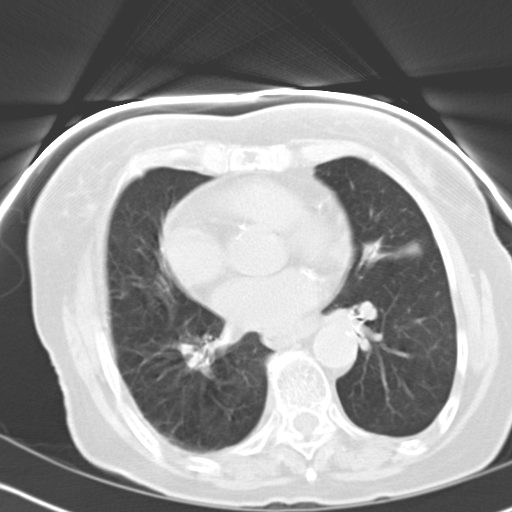
[im 33/60  lung]
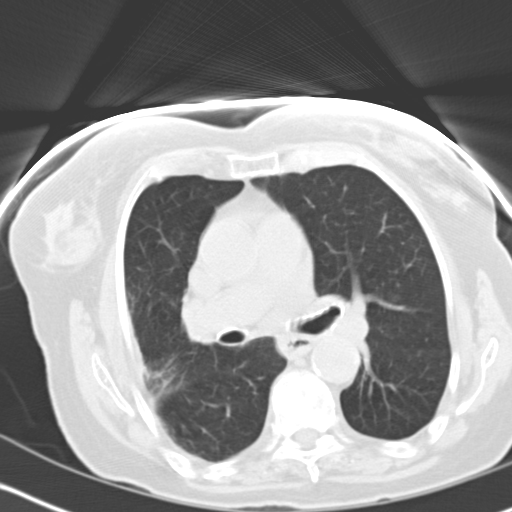
[im 38/60  lung]
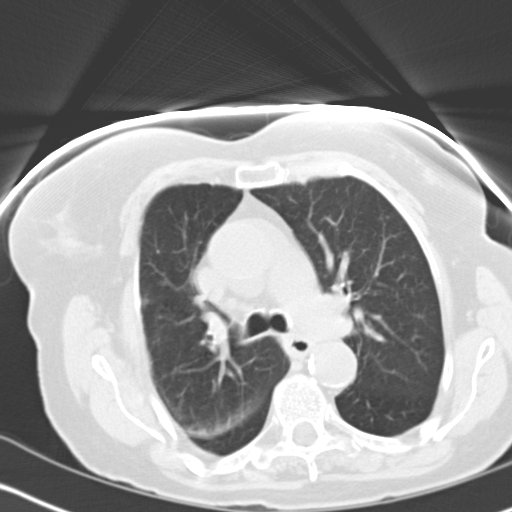
[im 42/60  mediastinal]
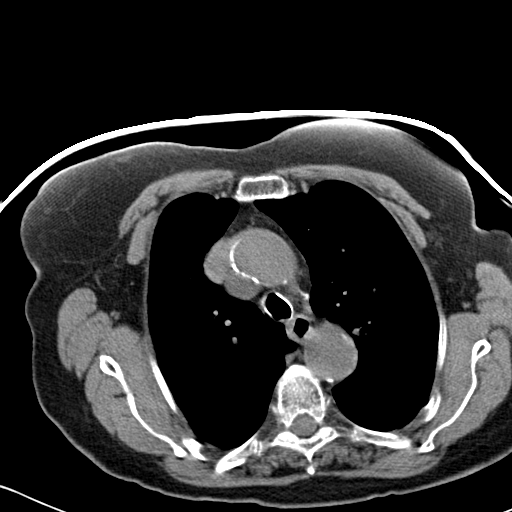
[im 42/60  lung]
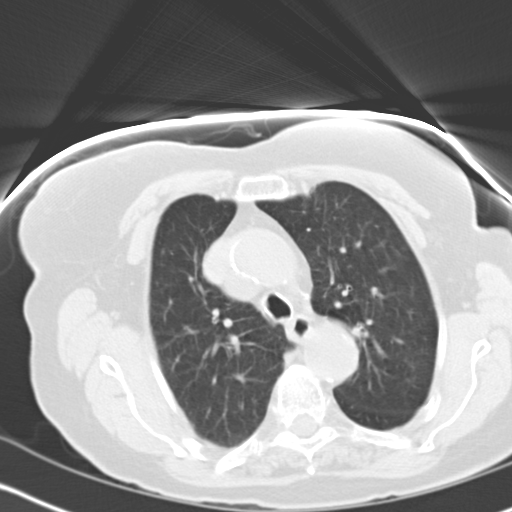
[im 46/60  lung]
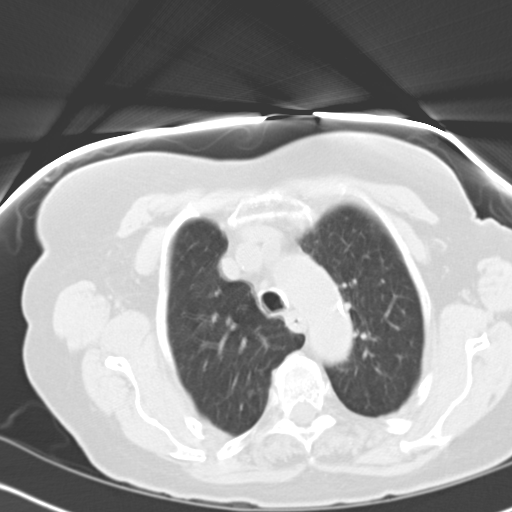
[im 51/60  lung]
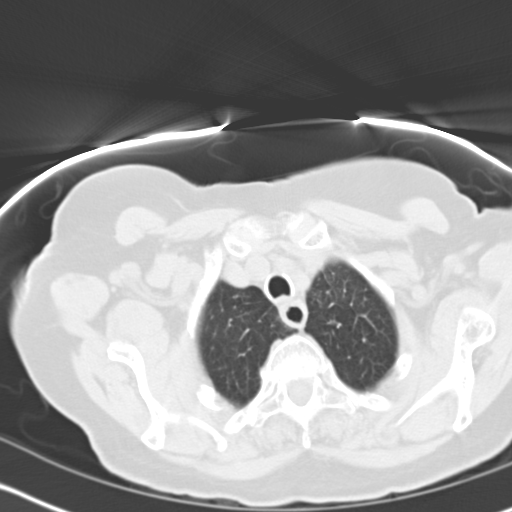
[im 55/60  lung]
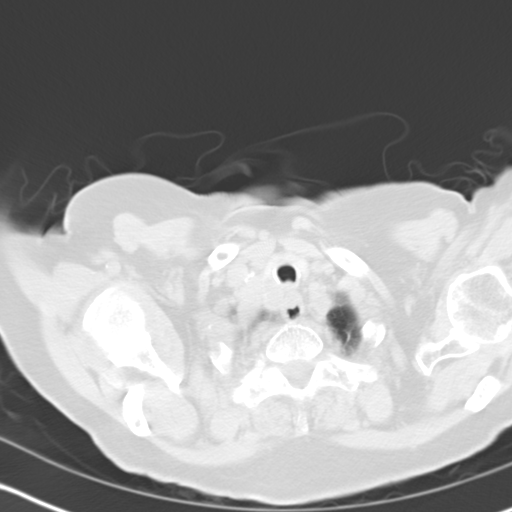

[Series 602: coronal chest · coronal · 0.61mm/px · 3 of 100 slices shown]
[im 20/100  lung]
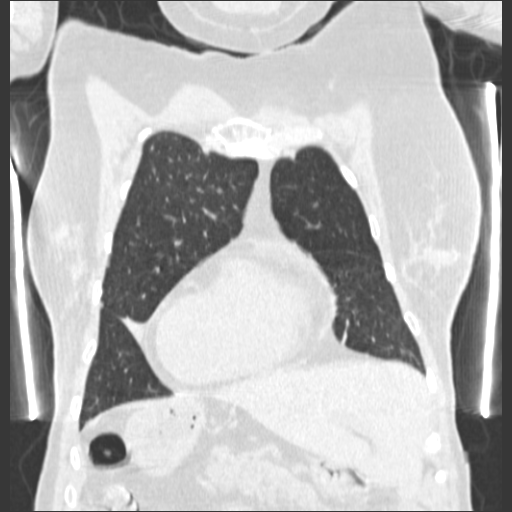
[im 40/100  lung]
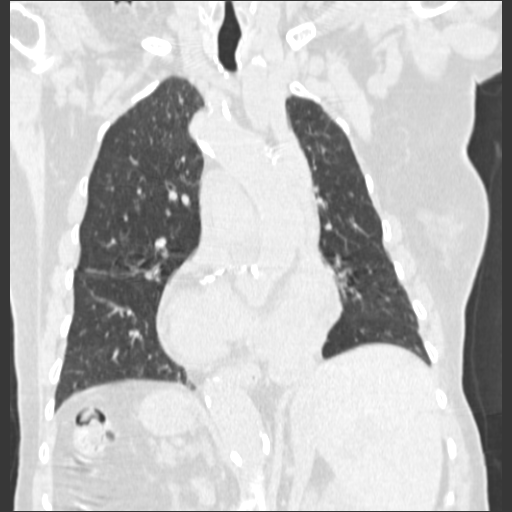
[im 60/100  lung]
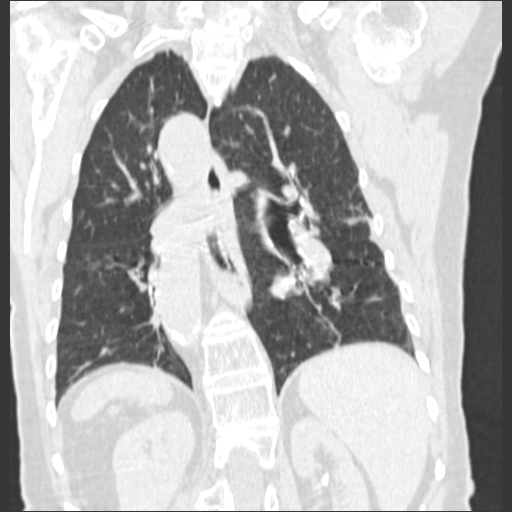

[15 of 36 positions shown; findings below may reference images not displayed]

FINDINGS: Noncalcified pleural-based nodular density is again seen
in the lateral left lung base on image 43.  This measures 6 mm and
is stable.  No other suspicious pulmonary nodules or masses are
identified in either lung.

Scarring is seen involving the the right upper and lower lobes and
lingula.  There is no evidence of pulmonary infiltrate or mass.
Central tracheobronchial airways are patent.

There is no evidence of pleural or pericardial effusion.  No hilar
mediastinal masses identified on this noncontrast day.  No evidence
of adenopathy elsewhere within the thorax.  Diffuse coronary artery
calcification noted.
IMPRESSION: Solitary 6 mm indeterminate pulmonary nodule in the lateral left
lung base. If the patient is at high risk for bronchogenic
carcinoma, follow-up chest CT at 6-12 months is recommended.  If
the patient is at low risk for bronchogenic carcinoma, follow-up
chest CT at 12 months is recommended.  This recommendation follows
the consensus statement: "Guidelines for Management of Small
Pulmonary Nodules Detected on CT Scans: A Statement from the
Online at:  [URL]

## 2011-10-03 ENCOUNTER — Emergency Department (HOSPITAL_BASED_OUTPATIENT_CLINIC_OR_DEPARTMENT_OTHER)
Admission: EM | Admit: 2011-10-03 | Discharge: 2011-10-03 | Disposition: A | Discharge status: Skilled Nursing Facility | Payer: Medicare Other | Attending: Emergency Medicine | Admitting: Emergency Medicine

## 2011-10-03 ENCOUNTER — Encounter (HOSPITAL_BASED_OUTPATIENT_CLINIC_OR_DEPARTMENT_OTHER): Payer: Self-pay | Admitting: Family Medicine

## 2011-10-03 DIAGNOSIS — IMO0001 Reserved for inherently not codable concepts without codable children: Secondary | ICD-10-CM | POA: Insufficient documentation

## 2011-10-03 DIAGNOSIS — F0280 Dementia in other diseases classified elsewhere without behavioral disturbance: Secondary | ICD-10-CM | POA: Insufficient documentation

## 2011-10-03 DIAGNOSIS — N390 Urinary tract infection, site not specified: Secondary | ICD-10-CM | POA: Insufficient documentation

## 2011-10-03 DIAGNOSIS — E039 Hypothyroidism, unspecified: Secondary | ICD-10-CM | POA: Insufficient documentation

## 2011-10-03 DIAGNOSIS — G309 Alzheimer's disease, unspecified: Secondary | ICD-10-CM | POA: Insufficient documentation

## 2011-10-03 DIAGNOSIS — M549 Dorsalgia, unspecified: Secondary | ICD-10-CM | POA: Insufficient documentation

## 2011-10-03 DIAGNOSIS — G2581 Restless legs syndrome: Secondary | ICD-10-CM | POA: Insufficient documentation

## 2011-10-03 HISTORY — DX: Hypoglycemia, unspecified: E16.2

## 2011-10-03 LAB — URINALYSIS, ROUTINE W REFLEX MICROSCOPIC
Bilirubin Urine: NEGATIVE
Nitrite: POSITIVE — AB
Specific Gravity, Urine: 1.025 (ref 1.005–1.030)
Urobilinogen, UA: 0.2 mg/dL (ref 0.0–1.0)

## 2011-10-03 LAB — CBC
MCH: 31.3 pg (ref 26.0–34.0)
Platelets: 230 10*3/uL (ref 150–400)
RBC: 3.93 MIL/uL (ref 3.87–5.11)
RDW: 13.6 % (ref 11.5–15.5)
WBC: 7.3 10*3/uL (ref 4.0–10.5)

## 2011-10-03 LAB — URINE MICROSCOPIC-ADD ON

## 2011-10-03 LAB — DIFFERENTIAL
Basophils Absolute: 0 10*3/uL (ref 0.0–0.1)
Basophils Relative: 0 % (ref 0–1)
Eosinophils Absolute: 0.1 10*3/uL (ref 0.0–0.7)
Lymphs Abs: 1.7 10*3/uL (ref 0.7–4.0)
Neutrophils Relative %: 62 % (ref 43–77)

## 2011-10-03 LAB — BASIC METABOLIC PANEL
GFR calc Af Amer: 57 mL/min — ABNORMAL LOW (ref >90)
GFR calc non Af Amer: 49 mL/min — ABNORMAL LOW (ref >90)
Glucose, Bld: 107 mg/dL — ABNORMAL HIGH (ref 70–99)
Potassium: 4.4 mEq/L (ref 3.5–5.1)
Sodium: 143 mEq/L (ref 135–145)

## 2011-10-03 MED ORDER — CEPHALEXIN 250 MG PO CAPS
500.0000 mg | ORAL_CAPSULE | Freq: Once | ORAL | Status: AC
  Administered 2011-10-03: 500 mg via ORAL
  Filled 2011-10-03: qty 2

## 2011-10-03 MED ORDER — TRAMADOL HCL 50 MG PO TABS: 50.0000 mg | ORAL_TABLET | Freq: Four times a day (QID) | ORAL | Status: AC

## 2011-10-03 MED ORDER — CEPHALEXIN 500 MG PO CAPS: 500.0000 mg | ORAL_CAPSULE | Freq: Four times a day (QID) | ORAL | Status: AC

## 2011-10-03 MED ORDER — TRAMADOL HCL 50 MG PO TABS
50.0000 mg | ORAL_TABLET | Freq: Once | ORAL | Status: AC
  Administered 2011-10-03: 50 mg via ORAL
  Filled 2011-10-03: qty 1

## 2011-10-03 NOTE — Discharge Instructions (Signed)

## 2011-10-03 NOTE — ED Notes (Signed)
[facility] here for transport now 

## 2011-10-03 NOTE — ED Notes (Addendum)
Pt is from [facility]. Pt brought via [facility] for generalized pain. Per NH staff, pt "always has pain". Pt not complaining of upon arrival. Called and spoke with Bonita Quin at [facility]. Pt started c/o mid back and side pain on 4/20. No recent trauma or fall noted per NH staff. Pt ambulates without assistance per staff.

## 2011-10-03 NOTE — ED Provider Notes (Signed)
History  This chart was scribed for Cyndra Numbers, MD by Bennett Scrape. This patient was seen in room MHH1/MHH1 and the patient's care was started at 4:36PM.  CSN: [id number]  Arrival date & time 10/03/11  1526   First MD Initiated Contact with Patient 10/03/11 1549     Level 5 Caveat- Pt with a h/o dementia  Chief Complaint  Patient presents with  . Back Pain  . Generalized Body Aches     The history is provided by the patient and the nursing home. No language interpreter was used.    Erin Davis is a 78 y.o. female with a history of dementia who is brought in by ambulance from a SNF, who presents to the Emergency Department complaining of 2 days of  generalized back and side pain. Pt is from [facility] and staff state that pt "always has pain" but has been complaining more frequently within the past 2 days. Pt denies pain currently. Staff deny any recent injuries or fall. Pt is oriented x1 at baseline and staff report that she is neurologically at her baseline. Per staff, pt is able to ambulate without assistance. She has a h/o COPD and hypoglycemia.  History is otherwise limited secondary to patient's dementia.   Past Medical History  Diagnosis Date  . Alzheimer disease   . Dementia   . Hypothyroid   . COPD (chronic obstructive pulmonary disease)   . Depression   . Anxiety   . Restless leg syndrome   . Chronic pain   . Hypoglycemia     Past Surgical History  Procedure Date  . Unknown     No family history on file.  History  Substance Use Topics  . Smoking status: Former Games developer  . Smokeless tobacco: Not on file  . Alcohol Use: No     Review of Systems  Unable to perform ROS: Dementia    Allergies  Review of patient's allergies indicates no known allergies.  Home Medications   Current Outpatient Rx  Name Route Sig Dispense Refill  . ASPIRIN 81 MG PO TBEC Oral Take 81 mg by mouth daily.      Marland Kitchen BENAZEPRIL HCL 10 MG PO TABS Oral Take 10 mg by  mouth daily.      Marland Kitchen LEVALBUTEROL HCL 0.63 MG/3ML IN NEBU Nebulization Take 1 ampule by nebulization every 4 (four) hours as needed.      Marland Kitchen LEVOTHYROXINE SODIUM 75 MCG PO TABS Oral Take 37.5 mcg by mouth daily.      Marland Kitchen LORAZEPAM 0.5 MG PO TABS Oral Take 0.5 mg by mouth every 8 (eight) hours as needed.      Marland Kitchen MEMANTINE HCL 10 MG PO TABS Oral Take 10 mg by mouth 2 (two) times daily.      Marland Kitchen OMEPRAZOLE 20 MG PO CPDR Oral Take 20 mg by mouth daily.      Marland Kitchen POLYETHYLENE GLYCOL 3350 PO PACK Oral Take 17 g by mouth daily.      . SENNA-DOCUSATE SODIUM 8.6-50 MG PO TABS Oral Take 1 tablet by mouth daily.      Marland Kitchen SIMVASTATIN 5 MG PO TABS Oral Take 5 mg by mouth at bedtime.      . TRAZODONE HCL 50 MG PO TABS Oral Take 50 mg by mouth at bedtime.        Triage Vitals: BP 106/65  Pulse 73  Temp(Src) 98.7 F (37.1 C) (Oral)  Resp 18  SpO2 93%  Physical Exam  Nursing note and vitals reviewed. Constitutional: She appears well-developed and well-nourished. No distress.  HENT:  Head: Normocephalic and atraumatic.  Eyes: Conjunctivae and EOM are normal.  Neck: Normal range of motion. Neck supple.  Cardiovascular: Normal rate, regular rhythm and normal heart sounds.  Exam reveals no gallop and no friction rub.   No murmur heard. Pulmonary/Chest: Effort normal and breath sounds normal. No respiratory distress. She has no wheezes. She has no rales.  Abdominal: Soft. Bowel sounds are normal. She exhibits no distension. There is tenderness (suprapubic tenderness). There is no rebound and no guarding.  Musculoskeletal: Normal range of motion. She exhibits no edema and no tenderness.       no tenderness along the C, T, or L spine  Neurological: She is alert. No cranial nerve deficit.       Oriented x1 at baseline  Skin: Skin is warm and dry. No rash noted.    ED Course  Procedures (including critical care time)  DIAGNOSTIC STUDIES: Oxygen Saturation is 93% on room air, adequate by my interpretation.     COORDINATION OF CARE: 4:43PM-Discussed blood work and urinalysis with pt and pt agreed.   Labs Reviewed  URINALYSIS, ROUTINE W REFLEX MICROSCOPIC - Abnormal; Notable for the following:    Ketones, ur 15 (*)    Nitrite POSITIVE (*)    Leukocytes, UA MODERATE (*)    All other components within normal limits  DIFFERENTIAL - Abnormal; Notable for the following:    Monocytes Relative 13 (*)    All other components within normal limits  BASIC METABOLIC PANEL - Abnormal; Notable for the following:    Glucose, Bld 107 (*)    GFR calc non Af Amer 49 (*)    GFR calc Af Amer 57 (*)    All other components within normal limits  URINE MICROSCOPIC-ADD ON - Abnormal; Notable for the following:    Bacteria, UA MANY (*)    All other components within normal limits  CBC  URINE CULTURE   No results found.   1. UTI (urinary tract infection)       MDM  Patient is an elderly female who presented based on SNF staff concern that she was complaining of pain slightly more than usual.  CBC and BMP were WNL here but UA was indicative of UTI.  Sample was a cathed one and urine culture was sent.  Patient was given keflex and discharged with a 10 day prescription for this.  Patient does not have any medications ordered for pain and she began complaining of constant pain later in her visit.  This is not out of   The norm for this patient even when well.  Tramadol was given and she was written for a a 6 hour dose to be given for 48 hours.  Patient was discharged back to her SNF and is to follow-up with her PCP.      I personally performed the services described in this documentation, which was scribed in my presence. The recorded information has been reviewed and considered.      Cyndra Numbers, MD 10/04/11 1104

## 2011-10-03 NOTE — ED Notes (Signed)
Dispatch called again to ensure pt is on list to be transferred. Assured by [facility] dispatch pt was on list but could not give me an ETA on [facility]

## 2011-10-03 NOTE — ED Notes (Signed)
[facility] has been called for transport to [facility]

## 2011-10-07 LAB — URINE CULTURE: Special Requests: NORMAL

## 2011-10-08 NOTE — ED Notes (Signed)
+   Urine  Treated per protocol MD; Sensitive to same 

## 2012-02-24 ENCOUNTER — Emergency Department (HOSPITAL_BASED_OUTPATIENT_CLINIC_OR_DEPARTMENT_OTHER)
Admission: EM | Admit: 2012-02-24 | Discharge: 2012-02-24 | Disposition: A | Discharge status: Skilled Nursing Facility | Payer: Medicare Other | Attending: Emergency Medicine | Admitting: Emergency Medicine

## 2012-02-24 ENCOUNTER — Encounter (HOSPITAL_BASED_OUTPATIENT_CLINIC_OR_DEPARTMENT_OTHER): Payer: Self-pay | Admitting: *Deleted

## 2012-02-24 DIAGNOSIS — J449 Chronic obstructive pulmonary disease, unspecified: Secondary | ICD-10-CM | POA: Insufficient documentation

## 2012-02-24 DIAGNOSIS — F0280 Dementia in other diseases classified elsewhere without behavioral disturbance: Secondary | ICD-10-CM | POA: Insufficient documentation

## 2012-02-24 DIAGNOSIS — G309 Alzheimer's disease, unspecified: Secondary | ICD-10-CM | POA: Insufficient documentation

## 2012-02-24 DIAGNOSIS — E039 Hypothyroidism, unspecified: Secondary | ICD-10-CM | POA: Insufficient documentation

## 2012-02-24 DIAGNOSIS — Z79899 Other long term (current) drug therapy: Secondary | ICD-10-CM | POA: Insufficient documentation

## 2012-02-24 DIAGNOSIS — G8929 Other chronic pain: Secondary | ICD-10-CM | POA: Insufficient documentation

## 2012-02-24 DIAGNOSIS — B86 Scabies: Secondary | ICD-10-CM | POA: Insufficient documentation

## 2012-02-24 DIAGNOSIS — F341 Dysthymic disorder: Secondary | ICD-10-CM | POA: Insufficient documentation

## 2012-02-24 DIAGNOSIS — J4489 Other specified chronic obstructive pulmonary disease: Secondary | ICD-10-CM | POA: Insufficient documentation

## 2012-02-24 DIAGNOSIS — Z7982 Long term (current) use of aspirin: Secondary | ICD-10-CM | POA: Insufficient documentation

## 2012-02-24 LAB — URINE MICROSCOPIC-ADD ON

## 2012-02-24 LAB — URINALYSIS, ROUTINE W REFLEX MICROSCOPIC
Bilirubin Urine: NEGATIVE
Glucose, UA: NEGATIVE mg/dL
Hgb urine dipstick: NEGATIVE
Specific Gravity, Urine: 1.02 (ref 1.005–1.030)
Urobilinogen, UA: 1 mg/dL (ref 0.0–1.0)
pH: 6 (ref 5.0–8.0)

## 2012-02-24 MED ORDER — PERMETHRIN 5 % EX CREA: TOPICAL_CREAM | CUTANEOUS | Status: AC

## 2012-02-24 NOTE — ED Provider Notes (Signed)
History     CSN: [id number]  Arrival date & time 02/24/12  1735   First MD Initiated Contact with Patient 02/24/12 1851      Chief Complaint  Patient presents with  . Abdominal Pain    (Consider location/radiation/quality/duration/timing/severity/associated sxs/prior treatment) Patient is a 78 y.o. female presenting with abdominal pain. The history is provided by the nursing home. History Limited By: dementia. No language interpreter was used.  Abdominal Pain The primary symptoms of the illness include abdominal pain.  Significant associated medical issues do not include diabetes.  Pt is reported to have complained of abdominal pain today.   Pt requesting food.  Pt eating icecream at the time of my evaluation.  Pt unable to give any further history due to alzheimers  Past Medical History  Diagnosis Date  . Alzheimer disease   . Dementia   . Hypothyroid   . COPD (chronic obstructive pulmonary disease)   . Depression   . Anxiety   . Restless leg syndrome   . Chronic pain   . Hypoglycemia     Past Surgical History  Procedure Date  . Unknown     No family history on file.  History  Substance Use Topics  . Smoking status: Former Games developer  . Smokeless tobacco: Not on file  . Alcohol Use: No    OB History    Grav Para Term Preterm Abortions TAB SAB Ect Mult Living                  Review of Systems  Gastrointestinal: Positive for abdominal pain.  Skin: Positive for rash.  All other systems reviewed and are negative.    Allergies  Review of patient's allergies indicates no known allergies.  Home Medications   Current Outpatient Rx  Name Route Sig Dispense Refill  . AMLODIPINE BESYLATE 10 MG PO TABS Oral Take 10 mg by mouth daily.    . ASPIRIN 81 MG PO TBEC Oral Take 81 mg by mouth daily.      Marland Kitchen BENAZEPRIL HCL 5 MG PO TABS Oral Take 5 mg by mouth daily.    Marland Kitchen LEVOTHYROXINE SODIUM 75 MCG PO TABS Oral Take 37.5 mcg by mouth daily.      Marland Kitchen LORATADINE 10 MG PO  TABS Oral Take 10 mg by mouth daily.    Marland Kitchen MEMANTINE HCL 10 MG PO TABS Oral Take 10 mg by mouth 2 (two) times daily.      . TAB-A-VITE/IRON PO TABS Oral Take 1 tablet by mouth daily.    Marland Kitchen OMEPRAZOLE 20 MG PO CPDR Oral Take 20 mg by mouth daily.      Marland Kitchen POLYETHYLENE GLYCOL 3350 PO PACK Oral Take 17 g by mouth daily.      Marland Kitchen POTASSIUM CHLORIDE CRYS ER 20 MEQ PO TBCR Oral Take 20 mEq by mouth 2 (two) times daily.    . SENNA-DOCUSATE SODIUM 8.6-50 MG PO TABS Oral Take 1 tablet by mouth daily.      Marland Kitchen SIMVASTATIN 5 MG PO TABS Oral Take 5 mg by mouth at bedtime.      . ALBUTEROL SULFATE (2.5 MG/3ML) 0.083% IN NEBU Nebulization Take 2.5 mg by nebulization every 6 (six) hours as needed.    Marland Kitchen BACITRACIN ZINC 500 UNIT/GM EX OINT Topical Apply 1 application topically 2 (two) times daily.    Marland Kitchen LEVALBUTEROL HCL 0.63 MG/3ML IN NEBU Nebulization Take 1 ampule by nebulization every 4 (four) hours as needed.      Marland Kitchen  PHENYLEPHRINE IN HARD FAT 0.25 % RE SUPP Rectal Place 1 suppository rectally 2 (two) times daily.      BP 125/64  Pulse 84  Temp 98.9 F (37.2 C) (Oral)  Resp 24  SpO2 92%  Physical Exam  Nursing note and vitals reviewed. Constitutional: She is oriented to person, place, and time. She appears well-developed and well-nourished.  HENT:  Head: Normocephalic and atraumatic.  Eyes: Conjunctivae normal are normal. Pupils are equal, round, and reactive to light.  Neck: Normal range of motion.  Cardiovascular: Normal rate and normal heart sounds.   Pulmonary/Chest: Effort normal.  Abdominal: Soft. There is no tenderness. There is no guarding.  Musculoskeletal: Normal range of motion.  Neurological: She is alert and oriented to person, place, and time. She has normal reflexes.  Skin: Rash noted.       Multiple burrows,  Worse on back  Psychiatric: She has a normal mood and affect.    ED Course  Procedures (including critical care time)  Labs Reviewed  URINALYSIS, ROUTINE W REFLEX MICROSCOPIC -  Abnormal; Notable for the following:    APPearance TURBID (*)     Leukocytes, UA SMALL (*)     All other components within normal limits  URINE MICROSCOPIC-ADD ON - Abnormal; Notable for the following:    Bacteria, UA FEW (*)     All other components within normal limits   No results found.   No diagnosis found.    MDM  elemite rx.       Lonia Skinner [place], [place] 02/24/12 2006  Lonia Skinner [place], [place] 02/24/12 2008

## 2012-02-24 NOTE — ED Provider Notes (Signed)
Patient is alert ambulatory in no distress. Asking for food, abdomen nontender  Doug Sou, MD 02/24/12 1936

## 2012-02-24 NOTE — ED Notes (Signed)
When asked if she has any c/o, pt smiles and states "no, honey, I feel fine.Marland KitchenMarland Kitchen"

## 2012-02-24 NOTE — ED Notes (Signed)
Pt to room 6 by ems via stretcher. Per ems, [facility] reports pt with chronic, ongoing abd pain. They felt her pain was worse today. Pt is awake, alert, pleasant and cooperative.

## 2012-02-25 NOTE — ED Provider Notes (Signed)
Medical screening examination/treatment/procedure(s) were conducted as a shared visit with non-physician practitioner(s) and myself.  I personally evaluated the patient during the encounter  Doug Sou, MD 02/25/12 [phone]

## 2012-05-31 ENCOUNTER — Emergency Department (HOSPITAL_BASED_OUTPATIENT_CLINIC_OR_DEPARTMENT_OTHER): Payer: Medicare Other

## 2012-05-31 ENCOUNTER — Encounter (HOSPITAL_BASED_OUTPATIENT_CLINIC_OR_DEPARTMENT_OTHER): Payer: Self-pay | Admitting: *Deleted

## 2012-05-31 ENCOUNTER — Emergency Department (HOSPITAL_BASED_OUTPATIENT_CLINIC_OR_DEPARTMENT_OTHER)
Admission: EM | Admit: 2012-05-31 | Discharge: 2012-05-31 | Disposition: A | Discharge status: Home / Self Care | Payer: Medicare Other | Attending: Emergency Medicine | Admitting: Emergency Medicine

## 2012-05-31 DIAGNOSIS — F411 Generalized anxiety disorder: Secondary | ICD-10-CM | POA: Insufficient documentation

## 2012-05-31 DIAGNOSIS — R52 Pain, unspecified: Secondary | ICD-10-CM | POA: Insufficient documentation

## 2012-05-31 DIAGNOSIS — Z79899 Other long term (current) drug therapy: Secondary | ICD-10-CM | POA: Insufficient documentation

## 2012-05-31 DIAGNOSIS — J449 Chronic obstructive pulmonary disease, unspecified: Secondary | ICD-10-CM | POA: Insufficient documentation

## 2012-05-31 DIAGNOSIS — F329 Major depressive disorder, single episode, unspecified: Secondary | ICD-10-CM | POA: Insufficient documentation

## 2012-05-31 DIAGNOSIS — F0280 Dementia in other diseases classified elsewhere without behavioral disturbance: Secondary | ICD-10-CM | POA: Insufficient documentation

## 2012-05-31 DIAGNOSIS — G8929 Other chronic pain: Secondary | ICD-10-CM | POA: Insufficient documentation

## 2012-05-31 DIAGNOSIS — Z87891 Personal history of nicotine dependence: Secondary | ICD-10-CM | POA: Insufficient documentation

## 2012-05-31 DIAGNOSIS — J4489 Other specified chronic obstructive pulmonary disease: Secondary | ICD-10-CM | POA: Insufficient documentation

## 2012-05-31 DIAGNOSIS — Z7982 Long term (current) use of aspirin: Secondary | ICD-10-CM | POA: Insufficient documentation

## 2012-05-31 DIAGNOSIS — F3289 Other specified depressive episodes: Secondary | ICD-10-CM | POA: Insufficient documentation

## 2012-05-31 DIAGNOSIS — R109 Unspecified abdominal pain: Secondary | ICD-10-CM | POA: Insufficient documentation

## 2012-05-31 DIAGNOSIS — G309 Alzheimer's disease, unspecified: Secondary | ICD-10-CM | POA: Insufficient documentation

## 2012-05-31 DIAGNOSIS — Z8669 Personal history of other diseases of the nervous system and sense organs: Secondary | ICD-10-CM | POA: Insufficient documentation

## 2012-05-31 DIAGNOSIS — E039 Hypothyroidism, unspecified: Secondary | ICD-10-CM | POA: Insufficient documentation

## 2012-05-31 LAB — CBC WITH DIFFERENTIAL/PLATELET
Basophils Absolute: 0 10*3/uL (ref 0.0–0.1)
Basophils Relative: 1 % (ref 0–1)
Eosinophils Absolute: 0.4 10*3/uL (ref 0.0–0.7)
Hemoglobin: 14.4 g/dL (ref 12.0–15.0)
MCH: 31.4 pg (ref 26.0–34.0)
MCHC: 33.8 g/dL (ref 30.0–36.0)
Monocytes Absolute: 0.8 10*3/uL (ref 0.1–1.0)
Monocytes Relative: 11 % (ref 3–12)
Neutro Abs: 4.4 10*3/uL (ref 1.7–7.7)
Neutrophils Relative %: 61 % (ref 43–77)
RDW: 14.4 % (ref 11.5–15.5)

## 2012-05-31 LAB — URINALYSIS, ROUTINE W REFLEX MICROSCOPIC
Bilirubin Urine: NEGATIVE
Ketones, ur: NEGATIVE mg/dL
Leukocytes, UA: NEGATIVE
Nitrite: NEGATIVE
Protein, ur: NEGATIVE mg/dL
Urobilinogen, UA: 1 mg/dL (ref 0.0–1.0)

## 2012-05-31 LAB — BASIC METABOLIC PANEL
BUN: 19 mg/dL (ref 6–23)
Chloride: 104 mEq/L (ref 96–112)
Creatinine, Ser: 0.8 mg/dL (ref 0.50–1.10)
GFR calc Af Amer: 74 mL/min — ABNORMAL LOW (ref >90)
GFR calc non Af Amer: 64 mL/min — ABNORMAL LOW (ref >90)
Potassium: 4.4 mEq/L (ref 3.5–5.1)

## 2012-05-31 LAB — POCT I-STAT, CHEM 8
Calcium, Ion: 1.23 mmol/L (ref 1.13–1.30)
HCT: 37 % (ref 36.0–46.0)
Sodium: 144 mEq/L (ref 135–145)
TCO2: 26 mmol/L (ref 0–100)

## 2012-05-31 MED ORDER — IOHEXOL 300 MG/ML  SOLN
100.0000 mL | Freq: Once | INTRAMUSCULAR | Status: AC | PRN
  Administered 2012-05-31: 100 mL via INTRAVENOUS

## 2012-05-31 MED ORDER — FAMOTIDINE IN NACL 20-0.9 MG/50ML-% IV SOLN
20.0000 mg | Freq: Once | INTRAVENOUS | Status: AC
  Administered 2012-05-31: 20 mg via INTRAVENOUS
  Filled 2012-05-31: qty 50

## 2012-05-31 MED ORDER — IOHEXOL 300 MG/ML  SOLN: 50.0000 mL | Freq: Once | INTRAMUSCULAR | Status: DC | PRN

## 2012-05-31 NOTE — ED Notes (Signed)
Patient transferred to across from the nurses station due to her continually attempting to get out of bed.

## 2012-05-31 NOTE — ED Notes (Signed)
Patient is continually getting out of bed and taking her clothes off.  RT sitting with pt.

## 2012-05-31 NOTE — ED Notes (Signed)
EMS reports that the patient has abdominal pain and generalized body aches for the last 3 days.  Hx of chronic pain.  LMB 05/30/12.

## 2012-05-31 NOTE — ED Provider Notes (Signed)
History     CSN: [id number]  Arrival date & time 05/31/12  0944   First MD Initiated Contact with Patient 05/31/12 1114      Chief Complaint  Patient presents with  . Generalized Body Aches  . Abdominal Pain    (Consider location/radiation/quality/duration/timing/severity/associated sxs/prior treatment) HPI Comments: Pt with hx of dementia brought from a nursing home with cc of abd pain. Pt is demented - LEVEL 5 CAVEAT FOR DEMENTIA.  Pt states she is doing well. No nausea, vomiting, diarrhea. Does admit to pain "all over" her abdomen. Nursing home reports that patient has been c/o of abdominal pain starting today, but that she is having regular BM and no nausea, emesis or diarrhea. Pt has been eating well.  Pt has no UTI like sx.  Patient is a 78 y.o. female presenting with abdominal pain. The history is provided by the patient, medical records and the EMS personnel.  Abdominal Pain The primary symptoms of the illness include abdominal pain.    Past Medical History  Diagnosis Date  . Alzheimer disease   . Dementia   . Hypothyroid   . COPD (chronic obstructive pulmonary disease)   . Depression   . Anxiety   . Restless leg syndrome   . Chronic pain   . Hypoglycemia     Past Surgical History  Procedure Date  . Unknown     No family history on file.  History  Substance Use Topics  . Smoking status: Former Games developer  . Smokeless tobacco: Not on file  . Alcohol Use: No    OB History    Grav Para Term Preterm Abortions TAB SAB Ect Mult Living                  Review of Systems  Unable to perform ROS: Dementia  Gastrointestinal: Positive for abdominal pain.    Allergies  Review of patient's allergies indicates no known allergies.  Home Medications   Current Outpatient Rx  Name  Route  Sig  Dispense  Refill  . ALBUTEROL SULFATE (2.5 MG/3ML) 0.083% IN NEBU   Nebulization   Take 2.5 mg by nebulization every 6 (six) hours as needed.         Marland Kitchen  AMLODIPINE BESYLATE 10 MG PO TABS   Oral   Take 10 mg by mouth daily.         . ASPIRIN 81 MG PO TBEC   Oral   Take 81 mg by mouth daily.           Marland Kitchen BACITRACIN ZINC 500 UNIT/GM EX OINT   Topical   Apply 1 application topically 2 (two) times daily.         Marland Kitchen BENAZEPRIL HCL 5 MG PO TABS   Oral   Take 5 mg by mouth daily.         Marland Kitchen LEVALBUTEROL HCL 0.63 MG/3ML IN NEBU   Nebulization   Take 1 ampule by nebulization every 4 (four) hours as needed.           Marland Kitchen LEVOTHYROXINE SODIUM 75 MCG PO TABS   Oral   Take 37.5 mcg by mouth daily.           Marland Kitchen LORATADINE 10 MG PO TABS   Oral   Take 10 mg by mouth daily.         Marland Kitchen MEMANTINE HCL 10 MG PO TABS   Oral   Take 10 mg by mouth 2 (two) times daily.           Marland Kitchen  TAB-A-VITE/IRON PO TABS   Oral   Take 1 tablet by mouth daily.         Marland Kitchen OMEPRAZOLE 20 MG PO CPDR   Oral   Take 20 mg by mouth daily.           Marland Kitchen PHENYLEPHRINE IN HARD FAT 0.25 % RE SUPP   Rectal   Place 1 suppository rectally 2 (two) times daily.         Marland Kitchen POLYETHYLENE GLYCOL 3350 PO PACK   Oral   Take 17 g by mouth daily.           Marland Kitchen POTASSIUM CHLORIDE CRYS ER 20 MEQ PO TBCR   Oral   Take 20 mEq by mouth 2 (two) times daily.         . SENNA-DOCUSATE SODIUM 8.6-50 MG PO TABS   Oral   Take 1 tablet by mouth daily.           Marland Kitchen SIMVASTATIN 5 MG PO TABS   Oral   Take 5 mg by mouth at bedtime.             BP 101/59  Pulse 65  Temp 98.9 F (37.2 C) (Oral)  Resp 20  SpO2 97%  Physical Exam  Nursing note and vitals reviewed. Constitutional: She appears well-developed and well-nourished.  HENT:  Head: Normocephalic and atraumatic.  Eyes: EOM are normal. Pupils are equal, round, and reactive to light.  Neck: Neck supple. No JVD present.  Cardiovascular: Normal rate, regular rhythm and normal heart sounds.   No murmur heard. Pulmonary/Chest: Effort normal. No respiratory distress.  Abdominal: Soft. She exhibits no  distension. There is tenderness. There is no rebound and no guarding.       Diffuse tenderness, mostly in the epigastrium, and the bilateral upper quadrants. Pt has normal bowel sounds and she has no rebound, guarding, and is noted to trying to get out of the bed on multiple occasions.   Neurological: She is alert.  Skin: Skin is warm and dry.    ED Course  Procedures (including critical care time)  Labs Reviewed  BASIC METABOLIC PANEL - Abnormal; Notable for the following:    GFR calc non Af Amer 64 (*)     GFR calc Af Amer 74 (*)     All other components within normal limits  POCT I-STAT, CHEM 8 - Abnormal; Notable for the following:    Glucose, Bld 105 (*)     All other components within normal limits  URINALYSIS, ROUTINE W REFLEX MICROSCOPIC  CBC WITH DIFFERENTIAL   Ct Abdomen Pelvis W Contrast  05/31/2012  *RADIOLOGY REPORT*  Clinical Data: Pain.  CT ABDOMEN AND PELVIS WITH CONTRAST  Technique:  Multidetector CT imaging of the abdomen and pelvis was performed following the standard protocol during bolus administration of intravenous contrast.  Contrast: OMNIPAQUE IOHEXOL 300 MG/ML  SOLN Multiple exams, including 04/30/2010 and 05/08/2009  Comparison: 05/08/2009  Findings: Lingular and right lower lobe scarring appears stable.  The liver, spleen, pancreas, and adrenal glands appear unremarkable.  The gallbladder and biliary system appear unremarkable.  Bilateral tiny renal cyst noted.  Bilateral extrarenal pelvis noted.  Aortoiliac atherosclerotic calcification is present.  Frothy fluid is noted in the colon including the distal colon, query diarrheal process.  A dilated loop of small bowel measuring at 4.5 cm is noted in the left abdomen, extending to a smaller caliber near the midline as shown on images 38-42 of series 5 with caliber  is only about 5 mm.  There is contrast both proximal and distal to this apparent transition.  Dextroconvex scoliosis is observed.  Superior and  inferior endplate concavity noted at L1 with sclerosis of the vertebral bodies suggesting subacute endplate compression fractures.  IMPRESSION:  1.  Subacute endplate compression fractures at L1.  No subluxation observed. 2.  Frothy fluid in the colon, query diarrheal process. 3.  Several dilated loops of left upper quadrant small bowel noted, with a transition in the mid abdomen near the level of the iliac crests, but with contrast on both sides of the transition.  I am uncertain whether this apparent narrowing is simply incidental/peristaltic given the lack of high-grade obstruction. Given the findings in the colon, the possibility of ileus and also might be raised.  Correlate with bowel sounds and bowel activity. 4.  Atherosclerosis. 5.  Tiny renal cysts. 6.  Scarring at the lung bases.   Original Report Authenticated By: Gaylyn Rong, M.D.      1. Abdominal pain       MDM  DDx includes: Pancreatitis Hepatobiliary pathology including cholecystitis Gastritis/PUD SBO Colitis AAA Tumors Colitis Intra abdominal abscess Thrombosis Mesenteric ischemia Diverticulitis Peritonitis Appendicitis Hernia Nephrolithiasis Pyelonephritis UTI/Cystitis  Pt comes in with cc of abd pain. No chest pain, sob. The pain is constant, and is located diffusely over the upper quadrants. Abd exam reveals tenderness to palpation of the epigastrium and the upper quadrants.  Initial impression is that her sx are GI or vascular in etiology. Will need to get a CT abd to ensure there is no aaa, GB dz, SBO, infection.  3:44 PM CT showed some swelling of the intestine, no obstruction, and i called and confirmed with the nursing home - she is not having any n/v/diarrhea/fevers. Pt's exam is still on peritoneal. Unsure etiology of the findings - with no leukocytosis or fevers. Will have pcp f/u. Nursing home informed of the findings.             Derwood Kaplan, MD 05/31/12 1545

## 2013-09-24 ENCOUNTER — Emergency Department (HOSPITAL_COMMUNITY): Payer: Medicare Other

## 2013-09-24 ENCOUNTER — Emergency Department (HOSPITAL_COMMUNITY)
Admission: EM | Admit: 2013-09-24 | Discharge: 2013-09-24 | Disposition: A | Discharge status: Skilled Nursing Facility | Payer: Medicare Other | Attending: Emergency Medicine | Admitting: Emergency Medicine

## 2013-09-24 ENCOUNTER — Encounter (HOSPITAL_COMMUNITY): Payer: Self-pay | Admitting: Emergency Medicine

## 2013-09-24 DIAGNOSIS — J449 Chronic obstructive pulmonary disease, unspecified: Secondary | ICD-10-CM | POA: Insufficient documentation

## 2013-09-24 DIAGNOSIS — F411 Generalized anxiety disorder: Secondary | ICD-10-CM | POA: Insufficient documentation

## 2013-09-24 DIAGNOSIS — Z79899 Other long term (current) drug therapy: Secondary | ICD-10-CM | POA: Insufficient documentation

## 2013-09-24 DIAGNOSIS — S82142A Displaced bicondylar fracture of left tibia, initial encounter for closed fracture: Secondary | ICD-10-CM

## 2013-09-24 DIAGNOSIS — Z87891 Personal history of nicotine dependence: Secondary | ICD-10-CM | POA: Insufficient documentation

## 2013-09-24 DIAGNOSIS — F0280 Dementia in other diseases classified elsewhere without behavioral disturbance: Secondary | ICD-10-CM | POA: Insufficient documentation

## 2013-09-24 DIAGNOSIS — G8929 Other chronic pain: Secondary | ICD-10-CM | POA: Insufficient documentation

## 2013-09-24 DIAGNOSIS — S79929A Unspecified injury of unspecified thigh, initial encounter: Secondary | ICD-10-CM

## 2013-09-24 DIAGNOSIS — S79919A Unspecified injury of unspecified hip, initial encounter: Secondary | ICD-10-CM | POA: Insufficient documentation

## 2013-09-24 DIAGNOSIS — Z7982 Long term (current) use of aspirin: Secondary | ICD-10-CM | POA: Insufficient documentation

## 2013-09-24 DIAGNOSIS — S82109A Unspecified fracture of upper end of unspecified tibia, initial encounter for closed fracture: Secondary | ICD-10-CM | POA: Insufficient documentation

## 2013-09-24 DIAGNOSIS — E039 Hypothyroidism, unspecified: Secondary | ICD-10-CM | POA: Insufficient documentation

## 2013-09-24 DIAGNOSIS — Z792 Long term (current) use of antibiotics: Secondary | ICD-10-CM | POA: Insufficient documentation

## 2013-09-24 DIAGNOSIS — G309 Alzheimer's disease, unspecified: Secondary | ICD-10-CM | POA: Insufficient documentation

## 2013-09-24 DIAGNOSIS — R296 Repeated falls: Secondary | ICD-10-CM | POA: Insufficient documentation

## 2013-09-24 DIAGNOSIS — Y9389 Activity, other specified: Secondary | ICD-10-CM | POA: Insufficient documentation

## 2013-09-24 DIAGNOSIS — Y9210 Unspecified residential institution as the place of occurrence of the external cause: Secondary | ICD-10-CM | POA: Insufficient documentation

## 2013-09-24 DIAGNOSIS — J4489 Other specified chronic obstructive pulmonary disease: Secondary | ICD-10-CM | POA: Insufficient documentation

## 2013-09-24 LAB — BASIC METABOLIC PANEL
BUN: 27 mg/dL — AB (ref 6–23)
CO2: 29 mEq/L (ref 19–32)
CREATININE: 0.96 mg/dL (ref 0.50–1.10)
Calcium: 8.8 mg/dL (ref 8.4–10.5)
Chloride: 104 mEq/L (ref 96–112)
GFR, EST AFRICAN AMERICAN: 59 mL/min — AB (ref >90)
GFR, EST NON AFRICAN AMERICAN: 51 mL/min — AB (ref >90)
GLUCOSE: 101 mg/dL — AB (ref 70–99)
Potassium: 3.9 mEq/L (ref 3.7–5.3)
Sodium: 142 mEq/L (ref 137–147)

## 2013-09-24 LAB — CBC WITH DIFFERENTIAL/PLATELET
BASOS PCT: 0 % (ref 0–1)
Basophils Absolute: 0 10*3/uL (ref 0.0–0.1)
EOS ABS: 0.1 10*3/uL (ref 0.0–0.7)
EOS PCT: 1 % (ref 0–5)
HEMATOCRIT: 33.4 % — AB (ref 36.0–46.0)
HEMOGLOBIN: 11.3 g/dL — AB (ref 12.0–15.0)
Lymphocytes Relative: 17 % (ref 12–46)
Lymphs Abs: 1.5 10*3/uL (ref 0.7–4.0)
MCH: 31.1 pg (ref 26.0–34.0)
MCHC: 33.8 g/dL (ref 30.0–36.0)
MCV: 92 fL (ref 78.0–100.0)
MONO ABS: 1 10*3/uL (ref 0.1–1.0)
MONOS PCT: 11 % (ref 3–12)
Neutro Abs: 6.3 10*3/uL (ref 1.7–7.7)
Neutrophils Relative %: 71 % (ref 43–77)
Platelets: 200 10*3/uL (ref 150–400)
RBC: 3.63 MIL/uL — ABNORMAL LOW (ref 3.87–5.11)
RDW: 13.4 % (ref 11.5–15.5)
WBC: 9 10*3/uL (ref 4.0–10.5)

## 2013-09-24 LAB — PROTIME-INR
INR: 1.1 (ref 0.00–1.49)
Prothrombin Time: 14 seconds (ref 11.6–15.2)

## 2013-09-24 MED ORDER — MORPHINE SULFATE 2 MG/ML IJ SOLN
2.0000 mg | Freq: Once | INTRAMUSCULAR | Status: AC
Start: 2013-09-24 — End: 2013-09-24
  Administered 2013-09-24: 2 mg via INTRAVENOUS
  Filled 2013-09-24: qty 1

## 2013-09-24 MED ORDER — ONDANSETRON HCL 4 MG/2ML IJ SOLN
4.0000 mg | Freq: Once | INTRAMUSCULAR | Status: AC
  Administered 2013-09-24: 4 mg via INTRAVENOUS
  Filled 2013-09-24: qty 2

## 2013-09-24 MED ORDER — HYDROCODONE-ACETAMINOPHEN 5-325 MG PO TABS: 0.5000 | ORAL_TABLET | ORAL | Status: DC | PRN

## 2013-09-24 NOTE — ED Notes (Signed)
Bed: WA17 Expected date:  Expected time:  Means of arrival:  Comments: 80 y/o F, fall

## 2013-09-24 NOTE — ED Notes (Signed)
Patient transported to CT 

## 2013-09-24 NOTE — Discharge Instructions (Signed)
Patient is to remain non-weightbearing on the left leg and [facility] as possible.  Knee Fracture, Adult A knee fracture is a break in any of the bones of the lower part of the thigh bone, the upper part of the bones of the lower leg, or of the kneecap. When the bones no longer meet the way they are supposed to it is called a dislocation. Sometimes there can be a dislocation along with fractures. SYMPTOMS  Symptoms may include pain, swelling, inability to bend the knee, deformity of the knee, and inability to walk.  DIAGNOSIS  This problem is usually diagnosed with x-rays. Special studies are sometimes done if a fracture is suspected but cannot be seen on ordinary x-rays. If vessels around the knee are injured, special tests may be done to see what the damage is. TREATMENT   The leg is usually splinted for the first couple of days to allow for swelling. After the swelling is down a cast is put on. Sometimes a cast is put on right away with the sides of the cast cut to allow the knee to swell. If the bones are in place, this may be all that is needed.  If the bones are out of place, medications for pain are given to allow them to be put back in place. If they are seriously out of place, surgery may be needed to hold the pieces or breaks in place using wires, pins, screws or metal plates.  Generally most fractures will heal in 4 to 6 weeks. HOME CARE INSTRUCTIONS   Use your crutches as directed.  To lessen the swelling, keep the injured leg elevated while sitting or lying down.  Apply ice to the injury for 15-20 minutes, 03-04 times per day while awake for 2 days. Put the ice in a plastic bag and place a thin towel between the bag of ice and your cast.  If you have a plaster or fiberglass cast:  Do not try to scratch the skin under the cast using sharp or pointed objects.  Check the skin around the cast every day. You may put lotion on any red  or sore areas.  Keep your cast dry and clean.  If you have a plaster splint:  Wear the splint as directed.  You may loosen the elastic around the splint if your toes become numb, tingle, or turn cold or blue.  Do not put pressure on any part of your cast or splint; it may break. Rest your cast only on a pillow the first 24 hours until it is fully hardened.  Your cast or splint can be protected during bathing with a plastic bag. Do not lower the cast or splint into water.  Only take over-the-counter or prescription medicines for pain, discomfort, or fever as directed by your caregiver.  See your caregiver soon if your cast gets damaged or breaks.  It is very important to keep all follow up appointments. Not following up as directed may result in a worsening of your condition or a failure of the fracture to heal properly. SEEK IMMEDIATE MEDICAL CARE IF:  You have continued severe pain.  You have more swelling than you did before the cast was put on.  The area below the fracture becomes painful.  Your skin or toenails below the injury turn blue or gray, or feel cold or numb.  There is drainage coming from under the cast.  New, unexplained symptoms  develop (drugs used in treatment may produce side effects). MAKE SURE YOU:   Understand these instructions.  Will watch your condition.  Will get help right away if you are not doing well or get worse. Document Released: 04/12/2006 Document Revised: 08/22/2011 Document Reviewed: 05/14/2007 [facility] Patient Information 2014 [place], [place].

## 2013-09-24 NOTE — ED Notes (Signed)
Patient transported to X-ray 

## 2013-09-24 NOTE — ED Provider Notes (Signed)
CSN: [id number]     Arrival date & time 09/24/13  [id number] History   First MD Initiated Contact with Patient 09/24/13 0830     Chief Complaint  Patient presents with  . Fall     (Consider location/radiation/quality/duration/timing/severity/associated sxs/prior Treatment) HPI Comments: Patient reportedly had a fall at the nursing home last night. Information provided by EMS. They were told by nursing home staff that the patient was ambulatory after the fall, but this morning upon awakening has been complaining of severe pain in the left leg and cannot bear weight.  EMS report no evidence of deformity, the patient has severe pain in the left hip, thigh area. She would not bear any weight upon their arrival.  Patient does not answer questions appropriately, does have a history of dementia. Level V Caveat due to dementia.  Patient is a 80 y.o. female presenting with fall.  Fall    Past Medical History  Diagnosis Date  . Alzheimer disease   . Dementia   . Hypothyroid   . COPD (chronic obstructive pulmonary disease)   . Depression   . Anxiety   . Restless leg syndrome   . Chronic pain   . Hypoglycemia    Past Surgical History  Procedure Laterality Date  . Unknown     No family history on file. History  Substance Use Topics  . Smoking status: Former Games developermoker  . Smokeless tobacco: Not on file  . Alcohol Use: No   OB History   Grav Para Term Preterm Abortions TAB SAB Ect Mult Living                 Review of Systems  Unable to perform ROS: Dementia  Musculoskeletal: Positive for arthralgias.      Allergies  Review of patient's allergies indicates no known allergies.  Home Medications   Prior to Admission medications   Medication Sig Start Date End Date Taking? Authorizing Provider  albuterol (PROVENTIL) (2.5 MG/3ML) 0.083% nebulizer solution Take 2.5 mg by nebulization every 6 (six) hours as needed.    Historical Provider, MD  amLODipine (NORVASC) 10 MG tablet Take 10  mg by mouth daily.    Historical Provider, MD  aspirin 81 MG EC tablet Take 81 mg by mouth daily.      Historical Provider, MD  bacitracin ointment Apply 1 application topically 2 (two) times daily.    Historical Provider, MD  benazepril (LOTENSIN) 5 MG tablet Take 5 mg by mouth daily.    Historical Provider, MD  levalbuterol Pauline Aus(XOPENEX) 0.63 MG/3ML nebulizer solution Take 1 ampule by nebulization every 4 (four) hours as needed.      Historical Provider, MD  levothyroxine (SYNTHROID, LEVOTHROID) 75 MCG tablet Take 37.5 mcg by mouth daily.      Historical Provider, MD  loratadine (CLARITIN) 10 MG tablet Take 10 mg by mouth daily.    Historical Provider, MD  memantine (NAMENDA) 10 MG tablet Take 10 mg by mouth 2 (two) times daily.      Historical Provider, MD  Multiple Vitamins-Iron (MULTIVITAMINS WITH IRON) TABS Take 1 tablet by mouth daily.    Historical Provider, MD  omeprazole (PRILOSEC) 20 MG capsule Take 20 mg by mouth daily.      Historical Provider, MD  phenylephrine (,USE FOR PREPARATION-H,) 0.25 % suppository Place 1 suppository rectally 2 (two) times daily.    Historical Provider, MD  polyethylene glycol (MIRALAX / GLYCOLAX) packet Take 17 g by mouth daily.      Historical Provider,  MD  potassium chloride SA (K-DUR,KLOR-CON) 20 MEQ tablet Take 20 mEq by mouth 2 (two) times daily.    Historical Provider, MD  sennosides-docusate sodium (SENOKOT-S) 8.6-50 MG tablet Take 1 tablet by mouth daily.      Historical Provider, MD  simvastatin (ZOCOR) 5 MG tablet Take 5 mg by mouth at bedtime.      Historical Provider, MD   BP 118/59  Pulse 88  Temp(Src) 99 F (37.2 C) (Oral)  Resp 16  SpO2 96% Physical Exam  Constitutional: She is oriented to person, place, and time. She appears well-developed and well-nourished. She appears distressed.  HENT:  Head: Normocephalic and atraumatic.  Right Ear: Hearing normal.  Left Ear: Hearing normal.  Nose: Nose normal.  Mouth/Throat: Oropharynx is clear  and moist and mucous membranes are normal.  Eyes: Conjunctivae and EOM are normal. Pupils are equal, round, and reactive to light.  Neck: Normal range of motion. Neck supple.  Cardiovascular: Regular rhythm, S1 normal and S2 normal.  Exam reveals no gallop and no friction rub.   No murmur heard. Pulses:      Dorsalis pedis pulses are 1+ on the right side, and 1+ on the left side.  Pulmonary/Chest: Effort normal and breath sounds normal. No respiratory distress. She exhibits no tenderness.  Abdominal: Soft. Normal appearance and bowel sounds are normal. There is no hepatosplenomegaly. There is no tenderness. There is no rebound, no guarding, no tenderness at McBurney's point and negative Murphy's sign. No hernia.  Musculoskeletal:       Left hip: She exhibits decreased range of motion and tenderness. She exhibits no deformity.  Neurological: She is alert and oriented to person, place, and time. She has normal strength. No cranial nerve deficit or sensory deficit. Coordination normal. GCS eye subscore is 4. GCS verbal subscore is 5. GCS motor subscore is 6.  Skin: Skin is warm, dry and intact. No rash noted. No cyanosis.  Psychiatric: She has a normal mood and affect. Her speech is normal and behavior is normal. Thought content normal.    ED Course  Procedures (including critical care time) Labs Review Labs Reviewed  CBC WITH DIFFERENTIAL - Abnormal; Notable for the following:    RBC 3.63 (*)    Hemoglobin 11.3 (*)    HCT 33.4 (*)    All other components within normal limits  BASIC METABOLIC PANEL - Abnormal; Notable for the following:    Glucose, Bld 101 (*)    BUN 27 (*)    GFR calc non Af Amer 51 (*)    GFR calc Af Amer 59 (*)    All other components within normal limits  PROTIME-INR    Imaging Review Dg Chest 1 View  09/24/2013   CLINICAL DATA:  left hip pain and left knee pain  EXAM: CHEST - 1 VIEW  COMPARISON:  DG CHEST 2 VIEW dated 01/07/2011  FINDINGS: Normal cardiac  silhouette. Scoliosis of the spine with chronic compression fractures. Lungs are hyperinflated. No effusion, infiltrate or pneumothorax. Linear atelectasis the right lung.  IMPRESSION: Hyperinflated lungs.  No acute findings.   Electronically Signed   By: Genevive Bi M.D.   On: 09/24/2013 09:45   Dg Hip Complete Left  09/24/2013   CLINICAL DATA:  Left hip pain  EXAM: LEFT HIP - COMPLETE 2+ VIEW  COMPARISON:  None.  FINDINGS: Hips are located. Dedicated view of the left hip demonstrates no femoral neck fracture. There is medial joint space narrowing. No evidence of pelvic  fracture or sacral fracture  IMPRESSION: 1. No acute osseous findings. 2. Bilateral mild osteoarthritis of the hip joints.   Electronically Signed   By: Genevive Bi M.D.   On: 09/24/2013 09:46   Ct Head Wo Contrast  09/24/2013   CLINICAL DATA:  History of fall.  EXAM: CT HEAD WITHOUT CONTRAST  CT CERVICAL SPINE WITHOUT CONTRAST  TECHNIQUE: Multidetector CT imaging of the head and cervical spine was performed following the standard protocol without intravenous contrast. Multiplanar CT image reconstructions of the cervical spine were also generated.  COMPARISON:  CT HEAD W/O CM dated 12/29/2009  FINDINGS: CT HEAD FINDINGS  No acute intracranial abnormality. Specifically, no hemorrhage, hydrocephalus, mass lesion, acute infarction, or significant intracranial injury. No acute calvarial abnormality. Age-appropriate global atrophy. Diffuse areas of low attenuation within the subcortical, deep, and periventricular white matter regions. Consistent with small vessel white matter ischemic changes. The paranasal sinuses and mastoid air cells are patent.  CT CERVICAL SPINE FINDINGS  There is no acute fracture nor dislocation. There is straightening of the normal cervical lordosis and mild dextroscoliosis within the cervical spine. These findings may be secondary to positioning, collar placement, and/or muscle spasm. Degenerative disc disease  changes are appreciated C4-5, C5-6, C6-7 most prominent at C6-7. Facet arthropathy also appreciated at these levels most prominent at the C4-5 level. Mild grade 1 anterolisthesis at C4-5 likely secondary to degenerative change. Degenerative changes also appreciated involving the articulation of the dens and anterior ring of C1.  The surrounding soft tissues are unremarkable.  Airway is patent.  The lung apices are unremarkable.  IMPRESSION: 1. Chronic an and lucent changes without acute intracranial abnormalities. 2. Multilevel spondylosis without acute osseous abnormalities within the cervical spine.   Electronically Signed   By: Salome Holmes M.D.   On: 09/24/2013 09:45   Ct Cervical Spine Wo Contrast  09/24/2013   CLINICAL DATA:  History of fall.  EXAM: CT HEAD WITHOUT CONTRAST  CT CERVICAL SPINE WITHOUT CONTRAST  TECHNIQUE: Multidetector CT imaging of the head and cervical spine was performed following the standard protocol without intravenous contrast. Multiplanar CT image reconstructions of the cervical spine were also generated.  COMPARISON:  CT HEAD W/O CM dated 12/29/2009  FINDINGS: CT HEAD FINDINGS  No acute intracranial abnormality. Specifically, no hemorrhage, hydrocephalus, mass lesion, acute infarction, or significant intracranial injury. No acute calvarial abnormality. Age-appropriate global atrophy. Diffuse areas of low attenuation within the subcortical, deep, and periventricular white matter regions. Consistent with small vessel white matter ischemic changes. The paranasal sinuses and mastoid air cells are patent.  CT CERVICAL SPINE FINDINGS  There is no acute fracture nor dislocation. There is straightening of the normal cervical lordosis and mild dextroscoliosis within the cervical spine. These findings may be secondary to positioning, collar placement, and/or muscle spasm. Degenerative disc disease changes are appreciated C4-5, C5-6, C6-7 most prominent at C6-7. Facet arthropathy also  appreciated at these levels most prominent at the C4-5 level. Mild grade 1 anterolisthesis at C4-5 likely secondary to degenerative change. Degenerative changes also appreciated involving the articulation of the dens and anterior ring of C1.  The surrounding soft tissues are unremarkable.  Airway is patent.  The lung apices are unremarkable.  IMPRESSION: 1. Chronic an and lucent changes without acute intracranial abnormalities. 2. Multilevel spondylosis without acute osseous abnormalities within the cervical spine.   Electronically Signed   By: Salome Holmes M.D.   On: 09/24/2013 09:45   Ct Hip Left Wo Contrast  09/24/2013  CLINICAL DATA:  Left hip pain secondary to a fall.  EXAM: CT OF THE LEFT HIP WITHOUT CONTRAST  TECHNIQUE: Multidetector CT imaging was performed according to the standard protocol. Multiplanar CT image reconstructions were also generated.  COMPARISON:  Radiographs dated 09/24/2013  FINDINGS: There is no fracture or dislocation. There is slight marginal osteophytes on the femoral head. There are tiny marginal osteophytes on the acetabulum. There is no joint effusion. No soft tissue calcifications. Adjacent muscle structures appear normal.  IMPRESSION: No acute abnormality of the left hip. Minimal degenerative changes of the hip joint.   Electronically Signed   By: Geanie CooleyJim  Maxwell M.D.   On: 09/24/2013 11:23   Ct Tibia Fibula Left Wo Contrast  09/24/2013   CLINICAL DATA:  Fall, knee pain.  Abnormal x-ray.  EXAM: CT TIBIA FIBULA LEFT WITHOUT CONTRAST  TECHNIQUE: Routine imaging was performed without intravenous contrast through the left tibia and fibula. Sagittal and coronal reconstructed images were performed and reviewed.  COMPARISON:  Plain films earlier today.  FINDINGS: As suspected on plain films, there is a fracture through the left lateral tibial plateau. This extends into the posterior lateral proximal tibial metaphysis. No fibular abnormality noted. Minimal depression of the lateral  tibial plateau of approximately 2 mm seen best on sagittal image 42. Associated small joint effusion. No additional acute bony abnormality.  IMPRESSION: Minimally depressed left lateral tibial plateau fracture.   Electronically Signed   By: Charlett NoseKevin  Dover M.D.   On: 09/24/2013 11:15   Dg Knee Complete 4 Views Left  09/24/2013   CLINICAL DATA:  Fall, diffuse left hip and knee pain.  EXAM: LEFT KNEE - COMPLETE 4+ VIEW  COMPARISON:  None.  FINDINGS: There is a small left knee joint effusion. Cortical disruption/irregularity noted in the proximal lateral tibia concerning for lateral tibial plateau fracture. This could be confirmed with CT. No additional acute bony abnormality.  IMPRESSION: Findings suspicious for lateral tibial plateau fracture. Small joint effusion. Consider CT for further evaluation.   Electronically Signed   By: Charlett NoseKevin  Dover M.D.   On: 09/24/2013 09:46     EKG Interpretation None      MDM   Final diagnoses:  Tibial plateau fracture, left    She brought to ER by him is from nursing home after a fall which occurred last night. Patient reportedly had no complaints after the fall, but today has been complaining of left leg pain and was unable to put weight on it. She was sent to the ER for possible hip injury. There were no deformities noted on examination. She had tenderness in the left knee as well as left hip area and pain with range of motion of both joints. X-ray of the hip was negative. X-ray of the knee showed return for possible tibial plateau fracture. CT scan of the hip and tibia were performed. CT scan was negative for hip fracture but did confirm lateral tibial plateau fracture.  Head CT, cervical spine CT were negative. Patient appears comfortable after a small dose of morphine.  Case was discussed with Doctor Yevette Edwardsumonski. He has evaluated the images and confirm that the patient can be managed as an outpatient with knee immobilizer, nonweightbearing. He'll see her in the  office.    Gilda Creasehristopher J. Pollina, MD 09/24/13 1250

## 2013-09-24 NOTE — ED Notes (Signed)
Pt arrives via EMS with c/o left hip pain as a result of a fall last night. Woke up with morning and unable to bare weight on left side, increased pain with movement. EMS VS [facility].

## 2013-10-03 ENCOUNTER — Encounter (HOSPITAL_COMMUNITY): Payer: Self-pay | Admitting: Emergency Medicine

## 2013-10-03 ENCOUNTER — Emergency Department (HOSPITAL_COMMUNITY)
Admission: EM | Admit: 2013-10-03 | Discharge: 2013-10-03 | Disposition: A | Discharge status: Home / Self Care | Payer: Medicare Other | Attending: Emergency Medicine | Admitting: Emergency Medicine

## 2013-10-03 ENCOUNTER — Emergency Department (HOSPITAL_COMMUNITY): Payer: Medicare Other

## 2013-10-03 DIAGNOSIS — F0280 Dementia in other diseases classified elsewhere without behavioral disturbance: Secondary | ICD-10-CM | POA: Insufficient documentation

## 2013-10-03 DIAGNOSIS — J4489 Other specified chronic obstructive pulmonary disease: Secondary | ICD-10-CM | POA: Insufficient documentation

## 2013-10-03 DIAGNOSIS — F411 Generalized anxiety disorder: Secondary | ICD-10-CM | POA: Insufficient documentation

## 2013-10-03 DIAGNOSIS — Z792 Long term (current) use of antibiotics: Secondary | ICD-10-CM | POA: Insufficient documentation

## 2013-10-03 DIAGNOSIS — Z79899 Other long term (current) drug therapy: Secondary | ICD-10-CM | POA: Insufficient documentation

## 2013-10-03 DIAGNOSIS — F3289 Other specified depressive episodes: Secondary | ICD-10-CM | POA: Insufficient documentation

## 2013-10-03 DIAGNOSIS — G309 Alzheimer's disease, unspecified: Secondary | ICD-10-CM | POA: Insufficient documentation

## 2013-10-03 DIAGNOSIS — Z87891 Personal history of nicotine dependence: Secondary | ICD-10-CM | POA: Insufficient documentation

## 2013-10-03 DIAGNOSIS — Z8781 Personal history of (healed) traumatic fracture: Secondary | ICD-10-CM | POA: Insufficient documentation

## 2013-10-03 DIAGNOSIS — E039 Hypothyroidism, unspecified: Secondary | ICD-10-CM | POA: Insufficient documentation

## 2013-10-03 DIAGNOSIS — G2581 Restless legs syndrome: Secondary | ICD-10-CM | POA: Insufficient documentation

## 2013-10-03 DIAGNOSIS — F329 Major depressive disorder, single episode, unspecified: Secondary | ICD-10-CM | POA: Insufficient documentation

## 2013-10-03 DIAGNOSIS — S9030XA Contusion of unspecified foot, initial encounter: Secondary | ICD-10-CM | POA: Insufficient documentation

## 2013-10-03 DIAGNOSIS — G8929 Other chronic pain: Secondary | ICD-10-CM | POA: Insufficient documentation

## 2013-10-03 DIAGNOSIS — Z7982 Long term (current) use of aspirin: Secondary | ICD-10-CM | POA: Insufficient documentation

## 2013-10-03 DIAGNOSIS — X58XXXA Exposure to other specified factors, initial encounter: Secondary | ICD-10-CM | POA: Insufficient documentation

## 2013-10-03 DIAGNOSIS — Y939 Activity, unspecified: Secondary | ICD-10-CM | POA: Insufficient documentation

## 2013-10-03 DIAGNOSIS — S93602A Unspecified sprain of left foot, initial encounter: Secondary | ICD-10-CM

## 2013-10-03 DIAGNOSIS — S93609A Unspecified sprain of unspecified foot, initial encounter: Secondary | ICD-10-CM | POA: Insufficient documentation

## 2013-10-03 DIAGNOSIS — Y929 Unspecified place or not applicable: Secondary | ICD-10-CM | POA: Insufficient documentation

## 2013-10-03 DIAGNOSIS — J449 Chronic obstructive pulmonary disease, unspecified: Secondary | ICD-10-CM | POA: Insufficient documentation

## 2013-10-03 NOTE — ED Provider Notes (Signed)
CSN: [id number]     Arrival date & time 10/03/13  1008 History   First MD Initiated Contact with Patient 10/03/13 1033     Chief Complaint  Patient presents with  . Foot Pain      HPI Patient presents to ED from [facility]. With complaints of swelling in left foot after fracture of left tibia. Physical therapy tech noticed this am that left foot was blue no pulse detected. Staff Stated that blueness and coldness started yesterday.   Past Medical History  Diagnosis Date  . Alzheimer disease   . Dementia   . Hypothyroid   . COPD (chronic obstructive pulmonary disease)   . Depression   . Anxiety   . Restless leg syndrome   . Chronic pain   . Hypoglycemia    Past Surgical History  Procedure Laterality Date  . Unknown     History reviewed. No pertinent family history. History  Substance Use Topics  . Smoking status: Former Games developermoker  . Smokeless tobacco: Not on file  . Alcohol Use: No   OB History   Grav Para Term Preterm Abortions TAB SAB Ect Mult Living                 Review of Systems  Unable to perform ROS: Dementia      Allergies  Review of patient's allergies indicates no known allergies.  Home Medications   Prior to Admission medications   Medication Sig Start Date End Date Taking? Authorizing Provider  albuterol (PROVENTIL) (2.5 MG/3ML) 0.083% nebulizer solution Take 2.5 mg by nebulization every 6 (six) hours as needed.   Yes Historical Provider, MD  amLODipine (NORVASC) 2.5 MG tablet Take 2.5 mg by mouth daily.   Yes Historical Provider, MD  aspirin 81 MG EC tablet Take 81 mg by mouth daily.     Yes Historical Provider, MD  bacitracin ([employer] BACITRACIN ZINC) ointment Apply 1 application topically 3 (three) times daily. Apply to rash/scab   Yes Historical Provider, MD  benazepril (LOTENSIN) 10 MG tablet Take 10 mg by mouth at bedtime.   Yes Historical Provider, MD  Cranberry Fruit 475 MG CAPS Take 1 capsule by mouth 2 (two) times daily.   Yes Historical  Provider, MD  erythromycin ophthalmic ointment Place 1 application into the right ear 2 (two) times daily.   Yes Historical Provider, MD  HYDROcodone-acetaminophen (NORCO/VICODIN) 5-325 MG per tablet Take 0.5 tablets by mouth every 4 (four) hours as needed for moderate pain. 09/24/13  Yes Gilda Creasehristopher J. Pollina, MD  hydrocortisone cream 1 % Apply 1 application topically 2 (two) times daily as needed for itching (hemorrhoids).    Yes Historical Provider, MD  levothyroxine (SYNTHROID, LEVOTHROID) 25 MCG tablet Take 37.5 mcg by mouth daily before breakfast.   Yes Historical Provider, MD  loratadine (CLARITIN) 10 MG tablet Take 10 mg by mouth daily.   Yes Historical Provider, MD  memantine (NAMENDA) 10 MG tablet Take 10 mg by mouth 2 (two) times daily.     Yes Historical Provider, MD  Multiple Vitamins-Iron (MULTIVITAMINS WITH IRON) TABS Take 1 tablet by mouth daily.   Yes Historical Provider, MD  omeprazole (PRILOSEC) 20 MG capsule Take 20 mg by mouth daily.     Yes Historical Provider, MD  phenylephrine (,USE FOR PREPARATION-H,) 0.25 % suppository Place 1 suppository rectally 2 (two) times daily as needed for hemorrhoids.    Yes Historical Provider, MD  polyethylene glycol (MIRALAX / GLYCOLAX) packet Take 17 g by mouth daily.  Yes Historical Provider, MD  senna-docusate (SENOKOT-S) 8.6-50 MG per tablet Take 2 tablets by mouth at bedtime.   Yes Historical Provider, MD  simvastatin (ZOCOR) 5 MG tablet Take 5 mg by mouth at bedtime.     Yes Historical Provider, MD  Skin Protectants, Misc. (EUCERIN) cream Apply 1 application topically 2 (two) times daily. Apply to upper and lower extremities twice daily   Yes Historical Provider, MD  traMADol (ULTRAM) 50 MG tablet Take 50 mg by mouth 3 (three) times daily.   Yes Historical Provider, MD  white petrolatum (VASELINE) GEL Apply 1 application topically daily. Apply to nose daily until healed   Yes Historical Provider, MD   BP 143/88  Pulse 110  Temp(Src)  98.4 F (36.9 C) (Oral)  Resp 28  SpO2 96% Physical Exam  Nursing note and vitals reviewed. Constitutional: She is oriented to person, place, and time. She appears well-developed and well-nourished. No distress.  HENT:  Head: Normocephalic and atraumatic.  Eyes: Pupils are equal, round, and reactive to light.  Neck: Normal range of motion.  Cardiovascular: Normal rate and intact distal pulses.   Pulmonary/Chest: No respiratory distress.  Abdominal: Normal appearance. She exhibits no distension.  Musculoskeletal:       Left foot: She exhibits decreased range of motion, tenderness and swelling.       Feet:  Neurological: She is alert and oriented to person, place, and time. No cranial nerve deficit.  Skin: Skin is warm and dry. No rash noted.  Psychiatric: She has a normal mood and affect. Her behavior is normal.    ED Course  Procedures (including critical care time)   I was able to Doppler pulses easily on left foot.  Labs Review Labs Reviewed - No data to display  Imaging Review Dg Ankle Complete Left  10/03/2013   CLINICAL DATA:  Left foot and ankle pain. Recent tibial plateau fracture.  EXAM: LEFT ANKLE COMPLETE - 3+ VIEW  COMPARISON:  Foot radiographs of the same day.  FINDINGS: Soft swelling is present over the media malleable is. The ankle joint is located. No acute fracture is present. There is no significant joint effusion.  IMPRESSION: Soft tissue swelling over the lateral mellitus without an underlying fracture. Ligamentous injury is not excluded.   Electronically Signed   By: Gennette Pachris  Mattern M.D.   On: 10/03/2013 11:34   Dg Foot Complete Left  10/03/2013   CLINICAL DATA:  Left foot and ankle pain.  EXAM: LEFT FOOT - COMPLETE 3+ VIEW  COMPARISON:  None.  FINDINGS: Moderate osteopenia is present. No acute bone or soft tissue abnormalities are present.  IMPRESSION: Negative left foot radiographs.   Electronically Signed   By: Gennette Pachris  Mattern M.D.   On: 10/03/2013 11:32       MDM   Final diagnoses:  Sprain of left foot       Nelia Shiobert L Dick Hark, MD 10/03/13 1218

## 2013-10-03 NOTE — Discharge Instructions (Signed)
Foot Sprain The muscles and cord like structures which attach muscle to bone (tendons) that surround the feet are made up of units. A foot sprain can occur at the weakest spot in any of these units. This condition is most often caused by injury to or overuse of the foot, as from playing contact sports, or aggravating a previous injury, or from poor conditioning, or obesity. SYMPTOMS  Pain with movement of the foot.  Tenderness and swelling at the injury site.  Loss of strength is present in moderate or severe sprains. THE THREE GRADES OR SEVERITY OF FOOT SPRAIN ARE:  Mild (Grade I): Slightly pulled muscle without tearing of muscle or tendon fibers or loss of strength.  Moderate (Grade II): Tearing of fibers in a muscle, tendon, or at the attachment to bone, with small decrease in strength.  Severe (Grade III): Rupture of the muscle-tendon-bone attachment, with separation of fibers. Severe sprain requires surgical repair. Often repeating (chronic) sprains are caused by overuse. Sudden (acute) sprains are caused by direct injury or over-use. DIAGNOSIS  Diagnosis of this condition is usually by your own observation. If problems continue, a caregiver may be required for further evaluation and treatment. X-rays may be required to make sure there are not breaks in the bones (fractures) present. Continued problems may require physical therapy for treatment. PREVENTION  Use strength and conditioning exercises appropriate for your sport.  Warm up properly prior to working out.  Use athletic shoes that are made for the sport you are participating in.  Allow adequate time for healing. Early return to activities makes repeat injury more likely, and can lead to an unstable arthritic foot that can result in prolonged disability. Mild sprains generally heal in 3 to 10 days, with moderate and severe sprains taking 2 to 10 weeks. Your caregiver can help you determine the proper time required for  healing. HOME CARE INSTRUCTIONS   Apply ice to the injury for 15-20 minutes, 03-04 times per day. Put the ice in a plastic bag and place a towel between the bag of ice and your skin.  An elastic wrap (like an Ace bandage) may be used to keep swelling down.  Keep foot above the level of the heart, or at least raised on a footstool, when swelling and pain are present.  Try to avoid use other than gentle range of motion while the foot is painful. Do not resume use until instructed by your caregiver. Then begin use gradually, not increasing use to the point of pain. If pain does develop, decrease use and continue the above measures, gradually increasing activities that do not cause discomfort, until you gradually achieve normal use.  Use crutches if and as instructed, and for the length of time instructed.  Keep injured foot and ankle wrapped between treatments.  Massage foot and ankle for comfort and to keep swelling down. Massage from the toes up towards the knee.  Only take over-the-counter or prescription medicines for pain, discomfort, or fever as directed by your caregiver. SEEK IMMEDIATE MEDICAL CARE IF:   Your pain and swelling increase, or pain is not controlled with medications.  You have loss of feeling in your foot or your foot turns cold or blue.  You develop new, unexplained symptoms, or an increase of the symptoms that brought you to your caregiver. MAKE SURE YOU:   Understand these instructions.  Will watch your condition.  Will get help right away if you are not doing well or get worse. Document Released:   11/19/2001 Document Revised: 08/22/2011 Document Reviewed: 01/17/2008 [employer] Patient Information 2014 [employer].  

## 2013-10-03 NOTE — ED Notes (Signed)
Patient presents to ED from [facility].  With complaints of swelling in left foot after fracture of left tibia.  Physical therapy tech noticed this am that left foot was blue no pulse detected. Staff  Stated that blueness and coldness started yesterday.

## 2014-05-25 ENCOUNTER — Encounter (HOSPITAL_BASED_OUTPATIENT_CLINIC_OR_DEPARTMENT_OTHER): Payer: Self-pay | Admitting: *Deleted

## 2014-05-25 ENCOUNTER — Inpatient Hospital Stay (HOSPITAL_BASED_OUTPATIENT_CLINIC_OR_DEPARTMENT_OTHER)
Admission: EM | Admit: 2014-05-25 | Discharge: 2014-05-27 | DRG: 603 | Payer: Medicare Other | Attending: Internal Medicine | Admitting: Internal Medicine

## 2014-05-25 DIAGNOSIS — E162 Hypoglycemia, unspecified: Secondary | ICD-10-CM | POA: Diagnosis present

## 2014-05-25 DIAGNOSIS — L02413 Cutaneous abscess of right upper limb: Secondary | ICD-10-CM | POA: Diagnosis present

## 2014-05-25 DIAGNOSIS — B958 Unspecified staphylococcus as the cause of diseases classified elsewhere: Secondary | ICD-10-CM | POA: Diagnosis present

## 2014-05-25 DIAGNOSIS — K5900 Constipation, unspecified: Secondary | ICD-10-CM | POA: Diagnosis present

## 2014-05-25 DIAGNOSIS — F0390 Unspecified dementia without behavioral disturbance: Secondary | ICD-10-CM | POA: Diagnosis present

## 2014-05-25 DIAGNOSIS — F0280 Dementia in other diseases classified elsewhere without behavioral disturbance: Secondary | ICD-10-CM | POA: Diagnosis present

## 2014-05-25 DIAGNOSIS — E039 Hypothyroidism, unspecified: Secondary | ICD-10-CM | POA: Diagnosis present

## 2014-05-25 DIAGNOSIS — L03113 Cellulitis of right upper limb: Principal | ICD-10-CM | POA: Diagnosis present

## 2014-05-25 DIAGNOSIS — F419 Anxiety disorder, unspecified: Secondary | ICD-10-CM | POA: Diagnosis present

## 2014-05-25 DIAGNOSIS — Z87891 Personal history of nicotine dependence: Secondary | ICD-10-CM | POA: Diagnosis not present

## 2014-05-25 DIAGNOSIS — E785 Hyperlipidemia, unspecified: Secondary | ICD-10-CM | POA: Diagnosis present

## 2014-05-25 DIAGNOSIS — I10 Essential (primary) hypertension: Secondary | ICD-10-CM | POA: Diagnosis present

## 2014-05-25 DIAGNOSIS — J449 Chronic obstructive pulmonary disease, unspecified: Secondary | ICD-10-CM | POA: Diagnosis present

## 2014-05-25 DIAGNOSIS — F329 Major depressive disorder, single episode, unspecified: Secondary | ICD-10-CM | POA: Diagnosis present

## 2014-05-25 DIAGNOSIS — K219 Gastro-esophageal reflux disease without esophagitis: Secondary | ICD-10-CM | POA: Diagnosis present

## 2014-05-25 DIAGNOSIS — R0902 Hypoxemia: Secondary | ICD-10-CM | POA: Diagnosis present

## 2014-05-25 DIAGNOSIS — G309 Alzheimer's disease, unspecified: Secondary | ICD-10-CM | POA: Diagnosis present

## 2014-05-25 DIAGNOSIS — G2581 Restless legs syndrome: Secondary | ICD-10-CM | POA: Diagnosis present

## 2014-05-25 HISTORY — DX: Hypoxemia: R09.02

## 2014-05-25 LAB — CBC
HEMATOCRIT: 36.6 % (ref 36.0–46.0)
Hemoglobin: 12 g/dL (ref 12.0–15.0)
MCH: 31.3 pg (ref 26.0–34.0)
MCHC: 32.8 g/dL (ref 30.0–36.0)
MCV: 95.6 fL (ref 78.0–100.0)
Platelets: 240 10*3/uL (ref 150–400)
RBC: 3.83 MIL/uL — ABNORMAL LOW (ref 3.87–5.11)
RDW: 13.3 % (ref 11.5–15.5)
WBC: 9.6 10*3/uL (ref 4.0–10.5)

## 2014-05-25 LAB — BASIC METABOLIC PANEL
ANION GAP: 11 (ref 5–15)
BUN: 26 mg/dL — AB (ref 6–23)
CHLORIDE: 106 meq/L (ref 96–112)
CO2: 30 mEq/L (ref 19–32)
Calcium: 9.1 mg/dL (ref 8.4–10.5)
Creatinine, Ser: 0.9 mg/dL (ref 0.50–1.10)
GFR, EST AFRICAN AMERICAN: 63 mL/min — AB (ref >90)
GFR, EST NON AFRICAN AMERICAN: 55 mL/min — AB (ref >90)
Glucose, Bld: 124 mg/dL — ABNORMAL HIGH (ref 70–99)
POTASSIUM: 4.3 meq/L (ref 3.7–5.3)
SODIUM: 147 meq/L (ref 137–147)

## 2014-05-25 MED ORDER — VANCOMYCIN HCL IN DEXTROSE 1-5 GM/200ML-% IV SOLN
1000.0000 mg | Freq: Once | INTRAVENOUS | Status: AC
  Administered 2014-05-25: 1000 mg via INTRAVENOUS
  Filled 2014-05-25: qty 200

## 2014-05-25 MED ORDER — PIPERACILLIN-TAZOBACTAM 3.375 G IVPB 30 MIN
3.3750 g | Freq: Once | INTRAVENOUS | Status: AC
  Administered 2014-05-25: 3.375 g via INTRAVENOUS
  Filled 2014-05-25 (×2): qty 50

## 2014-05-25 MED ORDER — VANCOMYCIN HCL IN DEXTROSE 750-5 MG/150ML-% IV SOLN
750.0000 mg | Freq: Once | INTRAVENOUS | Status: DC
  Filled 2014-05-25: qty 150

## 2014-05-25 MED ORDER — LIDOCAINE HCL (PF) 1 % IJ SOLN
5.0000 mL | Freq: Once | INTRAMUSCULAR | Status: AC
  Administered 2014-05-25: 5 mL via INTRADERMAL
  Filled 2014-05-25: qty 5

## 2014-05-25 NOTE — Progress Notes (Signed)
ANTIBIOTIC CONSULT NOTE - INITIAL  Pharmacy Consult for Vancomycin Indication: cellulitis  No Known Allergies   Labs: No results for input(s): WBC, HGB, PLT, LABCREA, CREATININE in the last 72 hours. CrCl cannot be calculated (Unknown ideal weight.). No results for input(s): VANCOTROUGH, VANCOPEAK, VANCORANDOM, GENTTROUGH, GENTPEAK, GENTRANDOM, TOBRATROUGH, TOBRAPEAK, TOBRARND, AMIKACINPEAK, AMIKACINTROU, AMIKACIN in the last 72 hours.   Microbiology: No results found for this or any previous visit (from the past 720 hour(s)).  Medical History: Past Medical History  Diagnosis Date  . Alzheimer disease   . Dementia   . Hypothyroid   . COPD (chronic obstructive pulmonary disease)   . Depression   . Anxiety   . Restless leg syndrome   . Chronic pain   . Hypoglycemia   . Hypoxia    Assessment: 80 year old female with cellulitis  Goal of Therapy:  Vancomycin trough level 10-15 mcg/ml  Plan:  Vancomycin 750 mg iv X 1 Will follow up for further doses  Thank you. Okey RegalLisa Dannie Woolen, PharmD [phone]  05/25/2014,9:50 PM

## 2014-05-25 NOTE — ED Notes (Signed)
Per PTAR - [facility] - pt noted to have rt arm redness, swelling and pain that began yesterday, staff reports symptoms have improved slightly however would like her to be evaluated. Pt is reported to be at baseline mental status, pt w/ hx of dementia.

## 2014-05-25 NOTE — ED Provider Notes (Signed)
CSN: [id number]     Arrival date & time 05/25/14  2117 History   First MD Initiated Contact with Patient 05/25/14 2130     Chief Complaint  Patient presents with  . Arm Pain  . Arm Swelling     (Consider location/radiation/quality/duration/timing/severity/associated sxs/prior Treatment) HPI Comments: 80 year old female with a past medical history of Alzheimer's disease, COPD, anxiety, depression, chronic pain, hypoglycemia and hypoxia presenting via EMS [facility] with right arm redness, pain and swelling 2 days. According to the staff, her arm is starting to look improved, however would like her to be evaluated. No reported fevers. At baseline mentation. Level V caveat secondary to dementia.  Patient is a 80 y.o. female presenting with arm pain. The history is provided by the EMS personnel.  Arm Pain    Past Medical History  Diagnosis Date  . Alzheimer disease   . Dementia   . Hypothyroid   . COPD (chronic obstructive pulmonary disease)   . Depression   . Anxiety   . Restless leg syndrome   . Chronic pain   . Hypoglycemia   . Hypoxia    Past Surgical History  Procedure Laterality Date  . Unknown     History reviewed. No pertinent family history. History  Substance Use Topics  . Smoking status: Former Games developermoker  . Smokeless tobacco: Not on file  . Alcohol Use: No   OB History    No data available     Review of Systems  Unable to perform ROS: Dementia      Allergies  Review of patient's allergies indicates no known allergies.  Home Medications   Prior to Admission medications   Medication Sig Start Date End Date Taking? Authorizing Provider  albuterol (PROVENTIL) (2.5 MG/3ML) 0.083% nebulizer solution Take 2.5 mg by nebulization every 6 (six) hours as needed.    Historical Provider, MD  amLODipine (NORVASC) 2.5 MG tablet Take 2.5 mg by mouth daily.    Historical Provider, MD  aspirin 81 MG EC tablet Take 81 mg by mouth daily.       Historical Provider, MD  bacitracin ([employer] BACITRACIN ZINC) ointment Apply 1 application topically 3 (three) times daily. Apply to rash/scab    Historical Provider, MD  benazepril (LOTENSIN) 10 MG tablet Take 10 mg by mouth at bedtime.    Historical Provider, MD  Cranberry Fruit 475 MG CAPS Take 1 capsule by mouth 2 (two) times daily.    Historical Provider, MD  erythromycin ophthalmic ointment Place 1 application into the right ear 2 (two) times daily.    Historical Provider, MD  HYDROcodone-acetaminophen (NORCO/VICODIN) 5-325 MG per tablet Take 0.5 tablets by mouth every 4 (four) hours as needed for moderate pain. 09/24/13   Gilda Creasehristopher J. Pollina, MD  hydrocortisone cream 1 % Apply 1 application topically 2 (two) times daily as needed for itching (hemorrhoids).     Historical Provider, MD  levothyroxine (SYNTHROID, LEVOTHROID) 25 MCG tablet Take 37.5 mcg by mouth daily before breakfast.    Historical Provider, MD  loratadine (CLARITIN) 10 MG tablet Take 10 mg by mouth daily.    Historical Provider, MD  memantine (NAMENDA) 10 MG tablet Take 10 mg by mouth 2 (two) times daily.      Historical Provider, MD  Multiple Vitamins-Iron (MULTIVITAMINS WITH IRON) TABS Take 1 tablet by mouth daily.    Historical Provider, MD  omeprazole (PRILOSEC) 20 MG capsule Take 20 mg by mouth daily.  Historical Provider, MD  phenylephrine (,USE FOR PREPARATION-H,) 0.25 % suppository Place 1 suppository rectally 2 (two) times daily as needed for hemorrhoids.     Historical Provider, MD  polyethylene glycol (MIRALAX / GLYCOLAX) packet Take 17 g by mouth daily.      Historical Provider, MD  senna-docusate (SENOKOT-S) 8.6-50 MG per tablet Take 2 tablets by mouth at bedtime.    Historical Provider, MD  simvastatin (ZOCOR) 5 MG tablet Take 5 mg by mouth at bedtime.      Historical Provider, MD  Skin Protectants, Misc. (EUCERIN) cream Apply 1 application topically 2 (two) times daily. Apply to upper and lower extremities  twice daily    Historical Provider, MD  traMADol (ULTRAM) 50 MG tablet Take 50 mg by mouth 3 (three) times daily.    Historical Provider, MD  white petrolatum (VASELINE) GEL Apply 1 application topically daily. Apply to nose daily until healed    Historical Provider, MD   BP 169/76 mmHg  Pulse 93  Temp(Src) 98.4 F (36.9 C) (Oral)  Resp 20  SpO2 92% Physical Exam  Constitutional: She is oriented to person, place, and time. She appears well-developed and well-nourished. No distress.  HENT:  Head: Normocephalic and atraumatic.  Mouth/Throat: Oropharynx is clear and moist.  Eyes: Conjunctivae and EOM are normal.  Neck: Normal range of motion. Neck supple.  Cardiovascular: Normal rate, regular rhythm and normal heart sounds.   Pulmonary/Chest: Effort normal and breath sounds normal. No respiratory distress.  Musculoskeletal: Normal range of motion. She exhibits no edema.  4 cm area of induration with central pustule just medial to right olecranon with large area of erythema and warmth both up arm and down forearm. FROM without pain.  Neurological: She is alert and oriented to person, place, and time. No sensory deficit.  Skin: Skin is warm and dry.  Psychiatric: She has a normal mood and affect. Her behavior is normal.  Nursing note and vitals reviewed.   ED Course  Procedures (including critical care time) INCISION AND DRAINAGE Performed by: Celene SkeenHess, Claudett Bayly Consent: Verbal consent obtained. Risks and benefits: risks, benefits and alternatives were discussed Type: abscess  Body area: right elbow  Anesthesia: local infiltration  Incision was made with a scalpel.  Local anesthetic: lidocaine 1% without epinephrine  Anesthetic total: 2 ml  Complexity: complex Blunt dissection to break up loculations  Drainage: purulent  Drainage amount: small  Packing material: none  Patient tolerance: Patient tolerated the procedure well with no immediate complications.    Labs  Review Labs Reviewed  CBC - Abnormal; Notable for the following:    RBC 3.83 (*)    All other components within normal limits  BASIC METABOLIC PANEL - Abnormal; Notable for the following:    Glucose, Bld 124 (*)    BUN 26 (*)    GFR calc non Af Amer 55 (*)    GFR calc Af Amer 63 (*)    All other components within normal limits    Imaging Review No results found.   EKG Interpretation None      MDM   Final diagnoses:  Right arm cellulitis   Patient with an area of cellulitis to her right elbow. She has full range of motion without any pain. Abscess drained, only a small amount of purulent drainage. At baseline mentation. Afebrile, nontoxic appearing. No leukocytosis. I tried contacting family at the numbers provided on patient's healthcare power of attorney, however one of the numbers was disconnected, and the other was  not accepting calls at this time. Patient will be admitted for IV antibiotics. Admission accepted by Dr. Lovell Sheehan, [facility], medical bed.  Discussed with attending Dr. Madilyn Hook who also evaluated patient and agrees with plan of care.   Kathrynn Speed, PA-C 05/25/14 2323  Tilden Fossa, MD 05/25/14 [phone]

## 2014-05-26 DIAGNOSIS — F0391 Unspecified dementia with behavioral disturbance: Secondary | ICD-10-CM

## 2014-05-26 DIAGNOSIS — L03113 Cellulitis of right upper limb: Principal | ICD-10-CM

## 2014-05-26 MED ORDER — VANCOMYCIN HCL IN DEXTROSE 750-5 MG/150ML-% IV SOLN
750.0000 mg | INTRAVENOUS | Status: DC
  Administered 2014-05-26: 750 mg via INTRAVENOUS
  Filled 2014-05-26 (×2): qty 150

## 2014-05-26 MED ORDER — SODIUM CHLORIDE 0.9 % IJ SOLN
3.0000 mL | Freq: Two times a day (BID) | INTRAMUSCULAR | Status: DC
  Administered 2014-05-26: 3 mL via INTRAVENOUS

## 2014-05-26 MED ORDER — TRIAMCINOLONE ACETONIDE 0.025 % EX CREA
1.0000 "application " | TOPICAL_CREAM | Freq: Two times a day (BID) | CUTANEOUS | Status: DC
  Administered 2014-05-26 – 2014-05-27 (×4): 1 via TOPICAL
  Filled 2014-05-26: qty 15

## 2014-05-26 MED ORDER — HYDROCORTISONE 1 % EX CREA: 1.0000 "application " | TOPICAL_CREAM | Freq: Two times a day (BID) | CUTANEOUS | Status: DC | PRN

## 2014-05-26 MED ORDER — TRAMADOL HCL 50 MG PO TABS
50.0000 mg | ORAL_TABLET | Freq: Three times a day (TID) | ORAL | Status: DC
  Administered 2014-05-26 (×2): 50 mg via ORAL
  Filled 2014-05-26 (×3): qty 1

## 2014-05-26 MED ORDER — SENNOSIDES-DOCUSATE SODIUM 8.6-50 MG PO TABS
2.0000 | ORAL_TABLET | Freq: Every day | ORAL | Status: DC
  Administered 2014-05-26: 2 via ORAL
  Filled 2014-05-26: qty 2

## 2014-05-26 MED ORDER — PANTOPRAZOLE SODIUM 40 MG PO TBEC
40.0000 mg | DELAYED_RELEASE_TABLET | Freq: Every day | ORAL | Status: DC
  Administered 2014-05-26 – 2014-05-27 (×2): 40 mg via ORAL
  Filled 2014-05-26 (×2): qty 1

## 2014-05-26 MED ORDER — TAB-A-VITE/IRON PO TABS
1.0000 | ORAL_TABLET | Freq: Every day | ORAL | Status: DC
  Administered 2014-05-26 – 2014-05-27 (×2): 1 via ORAL
  Filled 2014-05-26 (×2): qty 1

## 2014-05-26 MED ORDER — BENAZEPRIL HCL 10 MG PO TABS
10.0000 mg | ORAL_TABLET | Freq: Every day | ORAL | Status: DC
  Administered 2014-05-26: 10 mg via ORAL
  Filled 2014-05-26 (×3): qty 1

## 2014-05-26 MED ORDER — LORATADINE 10 MG PO TABS
10.0000 mg | ORAL_TABLET | Freq: Every day | ORAL | Status: DC
  Administered 2014-05-26 – 2014-05-27 (×2): 10 mg via ORAL
  Filled 2014-05-26 (×2): qty 1

## 2014-05-26 MED ORDER — ASPIRIN EC 81 MG PO TBEC
81.0000 mg | DELAYED_RELEASE_TABLET | Freq: Every day | ORAL | Status: DC
  Administered 2014-05-26 – 2014-05-27 (×2): 81 mg via ORAL
  Filled 2014-05-26 (×3): qty 1

## 2014-05-26 MED ORDER — HEPARIN SODIUM (PORCINE) 5000 UNIT/ML IJ SOLN
5000.0000 [IU] | Freq: Three times a day (TID) | INTRAMUSCULAR | Status: DC
  Administered 2014-05-26 – 2014-05-27 (×2): 5000 [IU] via SUBCUTANEOUS
  Filled 2014-05-26 (×6): qty 1

## 2014-05-26 MED ORDER — HYDROCERIN EX CREA
1.0000 "application " | TOPICAL_CREAM | Freq: Two times a day (BID) | CUTANEOUS | Status: DC
  Administered 2014-05-26 – 2014-05-27 (×4): 1 via TOPICAL
  Filled 2014-05-26: qty 113

## 2014-05-26 MED ORDER — SIMVASTATIN 5 MG PO TABS
5.0000 mg | ORAL_TABLET | Freq: Every day | ORAL | Status: DC
  Administered 2014-05-26: 5 mg via ORAL
  Filled 2014-05-26 (×3): qty 1

## 2014-05-26 MED ORDER — HYDRALAZINE HCL 20 MG/ML IJ SOLN: 5.0000 mg | Freq: Four times a day (QID) | INTRAMUSCULAR | Status: DC | PRN

## 2014-05-26 MED ORDER — MEMANTINE HCL 10 MG PO TABS
10.0000 mg | ORAL_TABLET | Freq: Two times a day (BID) | ORAL | Status: DC
  Administered 2014-05-26 – 2014-05-27 (×3): 10 mg via ORAL
  Filled 2014-05-26 (×5): qty 1

## 2014-05-26 MED ORDER — POLYETHYLENE GLYCOL 3350 17 G PO PACK
17.0000 g | PACK | Freq: Every day | ORAL | Status: DC
  Filled 2014-05-26 (×2): qty 1

## 2014-05-26 MED ORDER — AMLODIPINE BESYLATE 2.5 MG PO TABS
2.5000 mg | ORAL_TABLET | Freq: Every day | ORAL | Status: DC
  Administered 2014-05-26 – 2014-05-27 (×2): 2.5 mg via ORAL
  Filled 2014-05-26 (×2): qty 1

## 2014-05-26 MED ORDER — LEVOTHYROXINE SODIUM 75 MCG PO TABS
37.5000 ug | ORAL_TABLET | Freq: Every day | ORAL | Status: DC
Start: 2014-05-26 — End: 2014-05-28
  Administered 2014-05-26 – 2014-05-27 (×2): 37.5 ug via ORAL
  Filled 2014-05-26: qty 0.5
  Filled 2014-05-26 (×2): qty 1
  Filled 2014-05-26: qty 0.5

## 2014-05-26 NOTE — Progress Notes (Signed)
Patient seen and examined, database reviewed. Seen earlier today by my colleague Dr. Julian ReilGardner. Presented with right abscess/cellulitis status post incision and drainage in the ED. IV antibiotics and subsequent return to nursing home. Blood pressure in the high side, all medications reordered and started hydralazine as needed.  Clint LippsMutaz A Dorcus Riga Pager: [phone]: [phone] 05/26/2014, 1:05 PM

## 2014-05-26 NOTE — Progress Notes (Signed)
ANTIBIOTIC CONSULT NOTE - FOLLOW UP  Pharmacy Consult for Vancomycin  Indication: Cellulitis  No Known Allergies  Patient Measurements: RN estimate weight: 60-65 kg, pt to agitated to get weight currently   Vital Signs: Temp: 97.8 F (36.6 C) (12/14 0114) Temp Source: Axillary (12/14 0114) BP: 166/94 mmHg (12/14 0114) Pulse Rate: 88 (12/14 0114)  Labs:  Recent Labs  05/25/14 2240  WBC 9.6  HGB 12.0  PLT 240  CREATININE 0.90     Anti-infectives    Start     Dose/Rate Route Frequency Ordered Stop   05/25/14 2200  piperacillin-tazobactam (ZOSYN) IVPB 3.375 g     3.375 g100 mL/hr over 30 Minutes Intravenous  Once 05/25/14 2147 05/25/14 2306   05/25/14 2200  vancomycin (VANCOCIN) IVPB 750 mg/150 ml premix  Status:  Discontinued     750 mg150 mL/hr over 60 Minutes Intravenous  Once 05/25/14 2149 05/25/14 2155   05/25/14 2200  vancomycin (VANCOCIN) IVPB 1000 mg/200 mL premix     1,000 mg200 mL/hr over 60 Minutes Intravenous  Once 05/25/14 2155 05/26/14 0046      Assessment: 80 [facility] with R-arm cellulitis. WBC WNL, renal function appropriate for age, other labs as above.   Goal of Therapy:  Vancomycin trough level 10-15 mcg/ml  Plan:  -Vancomycin 1000 mg IV x 1 already given, then 750 mg IV q24h -Trend WBC, temp, renal function  -Drug levels as indicated   Abran DukeLedford, Olson Lucarelli 05/26/2014,1:17 AM

## 2014-05-26 NOTE — H&P (Signed)
[facility] History and Physical  Erin ClampDorothy M Mccready KVQ:[id number] DOB: 07/17/1921 DOA: 05/25/2014  Referring physician: EDP PCP: Florentina JennyRIPP, HENRY, MD   Chief Complaint: R arm abscess   HPI: Erin Davis is a 80 y.o. female with dementia, presents to ED from her NH with R arm redness, pain, swelling for past 2 days.  Per staff arm is actually improving however they wanted it looked at.  Patient at baseline mentation.  Review of Systems: Unable to perform due to dementia.  Past Medical History  Diagnosis Date  . Alzheimer disease   . Dementia   . Hypothyroid   . COPD (chronic obstructive pulmonary disease)   . Depression   . Anxiety   . Restless leg syndrome   . Chronic pain   . Hypoglycemia   . Hypoxia    Past Surgical History  Procedure Laterality Date  . Unknown     Social History:  reports that she has quit smoking. She does not have any smokeless tobacco history on file. She reports that she does not drink alcohol or use illicit drugs.  No Known Allergies  History reviewed. No pertinent family history.   Prior to Admission medications   Medication Sig Start Date End Date Taking? Authorizing Provider  amLODipine (NORVASC) 2.5 MG tablet Take 2.5 mg by mouth daily.   Yes Historical Provider, MD  aspirin 81 MG EC tablet Take 81 mg by mouth daily.     Yes Historical Provider, MD  benazepril (LOTENSIN) 10 MG tablet Take 10 mg by mouth at bedtime.   Yes Historical Provider, MD  Cranberry Fruit 475 MG CAPS Take 1 capsule by mouth 2 (two) times daily.   Yes Historical Provider, MD  erythromycin ophthalmic ointment Place 1 application into the right ear 2 (two) times daily.   Yes Historical Provider, MD  hydrocortisone cream 1 % Apply 1 application topically 2 (two) times daily as needed for itching (hemorrhoids).    Yes Historical Provider, MD  levothyroxine (SYNTHROID, LEVOTHROID) 25 MCG tablet Take 37.5 mcg by mouth daily before breakfast.   Yes Historical Provider, MD   loratadine (CLARITIN) 10 MG tablet Take 10 mg by mouth daily.   Yes Historical Provider, MD  memantine (NAMENDA) 10 MG tablet Take 10 mg by mouth 2 (two) times daily.     Yes Historical Provider, MD  Multiple Vitamins-Iron (MULTIVITAMINS WITH IRON) TABS Take 1 tablet by mouth daily.   Yes Historical Provider, MD  omeprazole (PRILOSEC) 20 MG capsule Take 20 mg by mouth daily.     Yes Historical Provider, MD  polyethylene glycol (MIRALAX / GLYCOLAX) packet Take 17 g by mouth daily.     Yes Historical Provider, MD  senna-docusate (SENOKOT-S) 8.6-50 MG per tablet Take 2 tablets by mouth at bedtime.   Yes Historical Provider, MD  simvastatin (ZOCOR) 5 MG tablet Take 5 mg by mouth at bedtime.     Yes Historical Provider, MD  Skin Protectants, Misc. (EUCERIN) cream Apply 1 application topically 2 (two) times daily. Apply to upper and lower extremities twice daily   Yes Historical Provider, MD  traMADol (ULTRAM) 50 MG tablet Take 50 mg by mouth 3 (three) times daily.   Yes Historical Provider, MD  triamcinolone (KENALOG) 0.025 % cream Apply 1 application topically 2 (two) times daily. Apply to rash on left arm   Yes Historical Provider, MD   Physical Exam: Filed Vitals:   05/26/14 0114  BP: 166/94  Pulse: 88  Temp: 97.8 F (36.6  C)  Resp: 19    BP 166/94 mmHg  Pulse 88  Temp(Src) 97.8 F (36.6 C) (Axillary)  Resp 19  SpO2 94%  General Appearance:    Alert, oriented, no distress, appears stated age  Head:    Normocephalic, atraumatic  Eyes:    PERRL, EOMI, sclera non-icteric        Nose:   Nares without drainage or epistaxis. Mucosa, turbinates normal  Throat:   Moist mucous membranes. Oropharynx without erythema or exudate.  Neck:   Supple. No carotid bruits.  No thyromegaly.  No lymphadenopathy.   Back:     No CVA tenderness, no spinal tenderness  Lungs:     Clear to auscultation bilaterally, without wheezes, rhonchi or rales  Chest wall:    No tenderness to palpitation  Heart:     Regular rate and rhythm without murmurs, gallops, rubs  Abdomen:     Soft, non-tender, nondistended, normal bowel sounds, no organomegaly  Genitalia:    deferred  Rectal:    deferred  Extremities:   No clubbing, cyanosis or edema.  Pulses:   2+ and symmetric all extremities  Skin:   R arm with 4 cm erythema surrounding abscess now drained.  Lymph nodes:   Cervical, supraclavicular, and axillary nodes normal  Neurologic:   CNII-XII intact. Normal strength, sensation and reflexes      throughout    Labs on Admission:  Basic Metabolic Panel:  Recent Labs Lab 05/25/14 2240  NA 147  K 4.3  CL 106  CO2 30  GLUCOSE 124*  BUN 26*  CREATININE 0.90  CALCIUM 9.1   Liver Function Tests: No results for input(s): AST, ALT, ALKPHOS, BILITOT, PROT, ALBUMIN in the last 168 hours. No results for input(s): LIPASE, AMYLASE in the last 168 hours. No results for input(s): AMMONIA in the last 168 hours. CBC:  Recent Labs Lab 05/25/14 2240  WBC 9.6  HGB 12.0  HCT 36.6  MCV 95.6  PLT 240   Cardiac Enzymes: No results for input(s): CKTOTAL, CKMB, CKMBINDEX, TROPONINI in the last 168 hours.  BNP (last 3 results) No results for input(s): PROBNP in the last 8760 hours. CBG: No results for input(s): GLUCAP in the last 168 hours.  Radiological Exams on Admission: No results found.  EKG: Independently reviewed.  Assessment/Plan Active Problems:   Right arm cellulitis   1. R arm cellulitis and abscess - S/P drainage by EDP 1. Culture ordered 2. Putting patient on vancomycin IV empirically. 3. No evidence of systemic involvement at this point. 2. Dementia 1. Chronic and baseline    Code Status: Full Code  Family Communication: No family present Disposition Plan: Admit to inpatient   Time spent: 50 min  Avid Guillette M. [facility] Pager [phone]  If 7AM-7PM, please contact the day team taking care of the patient [email or web address] Password [email or web address] 05/26/2014, 1:55  AM

## 2014-05-26 NOTE — Care Management Note (Signed)
  Page 1 of 1   05/26/2014     3:13:27 PM CARE MANAGEMENT NOTE 05/26/2014  Patient:  Erin Davis,Erin Davis   Account Number:  [id number]  Date Initiated:  05/26/2014  Documentation initiated by:  Erin Davis,Erin Davis  Subjective/Objective Assessment:     Action/Plan:   Anticipated DC Date:  05/28/2014   Anticipated DC Plan:  ASSISTED LIVING / REST HOME  In-house referral  Clinical Social Worker         Choice offered to / List presented to:             Status of service:  In process, will continue to follow Medicare Important Message given?  YES (If response is "NO", the following Medicare IM given date fields will be blank) Date Medicare IM given:  05/26/2014 Medicare IM given by:  Erin Davis,Erin Davis Date Additional Medicare IM given:   Additional Medicare IM given by:    Discharge Disposition:    Per UR Regulation:    If discussed at Long Length of Stay Meetings, dates discussed:    Comments:

## 2014-05-27 DIAGNOSIS — F0390 Unspecified dementia without behavioral disturbance: Secondary | ICD-10-CM | POA: Diagnosis present

## 2014-05-27 LAB — CBC
HEMATOCRIT: 35.4 % — AB (ref 36.0–46.0)
HEMOGLOBIN: 11.8 g/dL — AB (ref 12.0–15.0)
MCH: 30.5 pg (ref 26.0–34.0)
MCHC: 33.3 g/dL (ref 30.0–36.0)
MCV: 91.5 fL (ref 78.0–100.0)
Platelets: 242 10*3/uL (ref 150–400)
RBC: 3.87 MIL/uL (ref 3.87–5.11)
RDW: 13.2 % (ref 11.5–15.5)
WBC: 5.2 10*3/uL (ref 4.0–10.5)

## 2014-05-27 LAB — BASIC METABOLIC PANEL
Anion gap: 13 (ref 5–15)
BUN: 14 mg/dL (ref 6–23)
CHLORIDE: 102 meq/L (ref 96–112)
CO2: 26 meq/L (ref 19–32)
CREATININE: 0.75 mg/dL (ref 0.50–1.10)
Calcium: 9 mg/dL (ref 8.4–10.5)
GFR calc non Af Amer: 72 mL/min — ABNORMAL LOW (ref >90)
GFR, EST AFRICAN AMERICAN: 84 mL/min — AB (ref >90)
Glucose, Bld: 101 mg/dL — ABNORMAL HIGH (ref 70–99)
POTASSIUM: 3.8 meq/L (ref 3.7–5.3)
Sodium: 141 mEq/L (ref 137–147)

## 2014-05-27 MED ORDER — CEPHALEXIN 500 MG PO CAPS: 500.0000 mg | ORAL_CAPSULE | Freq: Three times a day (TID) | ORAL | Status: AC

## 2014-05-27 MED ORDER — DOXYCYCLINE HYCLATE 100 MG PO TABS: 100.0000 mg | ORAL_TABLET | Freq: Two times a day (BID) | ORAL | Status: AC

## 2014-05-27 MED ORDER — VANCOMYCIN HCL 500 MG IV SOLR
500.0000 mg | INTRAVENOUS | Status: DC
  Filled 2014-05-27: qty 500

## 2014-05-27 NOTE — Clinical Social Work Psychosocial (Signed)
Clinical Social Work Department BRIEF PSYCHOSOCIAL ASSESSMENT 05/27/2014  Patient:  Erin Davis,Erin Davis     Account Number:  [id number]     Admit date:  05/25/2014  Clinical Social Worker:  Mosie EpsteinVAUGHN,Terena Bohan S, LCSWA  Date/Time:  05/27/2014 12:43 PM  Referred by:  Physician  Date Referred:  05/27/2014 Referred for  SNF Placement   Other Referral:   none.   Interview type:  Family Other interview type:   CSW spoke with pt's daughter, Erin BertholdLinda Davis.    PSYCHOSOCIAL DATA Living Status:  FACILITY Admitted from facility:  Other Level of care:  Assisted Living Primary support name:  Erin BertholdLinda Davis Primary support relationship to patient:  CHILD, ADULT Degree of support available:   Strong support system.    CURRENT CONCERNS Current Concerns  Post-Acute Placement   Other Concerns:   none.    SOCIAL WORK ASSESSMENT / PLAN CSW received referral stating [facility] location) and to possibly return at time of discharge. CSW spoke with pt's daughter who confirmed pt's family agrees with discharge plan [facility] at time of discharge. CSW confirmed discharge [facility] with [facility] admissions liasion.    CSW to continue to follow and assist with discharge planning needs.   Assessment/plan status:  Psychosocial Support/Ongoing Assessment of Needs Other assessment/ plan:   none.   Information/referral to community resources:   [facility]([place] location)    PATIENT'S/FAMILY'S RESPONSE TO PLAN OF CARE: Pt's family understanding and agreeable to CSW plan of care. Pt's family expressed no further questions or concerns at this time.       Erin Davis Erin Davis, LCSWA [phone]) Licensed Clinical Social Worker Orthopedics [phone]([id number]) and Surgical [phone]([id number])

## 2014-05-27 NOTE — Evaluation (Signed)
Occupational Therapy Evaluation Patient Details Name: Erin ClampDorothy M Davis MRN: [id number] DOB: 10/13/1921 Today's Date: 05/27/2014    History of Present Illness Erin Davis is a 80 y.o. female with dementia, presents to ED from her NH with R arm redness, pain, swelling for past 2 days.  Per staff arm is actually improving however they wanted it looked at.  Patient at baseline mentation.   Clinical Impression   Pt is currently as baseline level needing min assist overall with max instructional cueing to complete basic selfcare taskss and toileting.  RUE function is WFLs for ADLs with only slight edema and pain to the touch on the posterior aspect near the elbow.  It does not limit pt's function with regards to selfcare tasks and ADLs.  Feel pt will continue to need 24 hour assist at memory care unit for all activities secondary to baseline dementia.  No further acute or post acute OT needs.     Follow Up Recommendations  No OT follow up;Supervision/Assistance - 24 hour    Equipment Recommendations  None recommended by OT       Precautions / Restrictions Precautions Precautions: Fall Precaution Comments: baseline dementia Restrictions Weight Bearing Restrictions: No      Mobility Bed Mobility Overal bed mobility: Needs Assistance Bed Mobility: Supine to Sit;Sit to Supine     Supine to sit: Min assist;HOB elevated Sit to supine: Min assist      Transfers Overall transfer level: Needs assistance Equipment used: Ambulation equipment used;Rolling walker (2 wheeled) Transfers: Sit to/from Stand Sit to Stand: Min assist         General transfer comment: Pt needed min assist for initial sit to stand from the EOB and for directing walker to the bathroom.    Balance Overall balance assessment: Needs assistance   Sitting balance-Leahy Scale: Fair       Standing balance-Leahy Scale: Fair Standing balance comment: Pt able to maintain static standing at the sink while  washing her hands with min guard assist.  Min hand held assist needed to ambulate back to the bed from the sink.                            ADL Overall ADL's : At baseline                                       General ADL Comments: Pt was at memory care facility so it would appear that she required at least min assist for most selfcare tasks.  She needed min guard assist with mod instructional cueing to ambulate to the bathroom.  Max assist for hygiene secondary to pt no performing it even when cued.        Perception Perception Perception Tested?: No   Praxis Praxis Praxis tested?: Not tested    Pertinent Vitals/Pain Pain Assessment: No/denies pain     Hand Dominance Right   Extremity/Trunk Assessment Upper Extremity Assessment Upper Extremity Assessment: Overall WFL for tasks assessed (Pt with increased swelling in the left posterior aspect of the elbow but does not limit AROM or functional use at this time.  Noted pt grimmacing when therapist touched the spot however.)   Lower Extremity Assessment Lower Extremity Assessment: Overall WFL for tasks assessed   Cervical / Trunk Assessment Cervical / Trunk Assessment: Kyphotic   Communication Communication Communication: No difficulties  Cognition Arousal/Alertness: Awake/alert Behavior During Therapy: WFL for tasks assessed/performed Overall Cognitive Status: History of cognitive impairments - at baseline                                Home Living Family/patient expects to be discharged to:: Other (Comment) (memory care unit)                                        Prior Functioning/Environment Level of Independence: Needs assistance (unable to determine baseline as pt unable to state and no family present)             OT Diagnosis: Cognitive deficits;Generalized weakness                          End of Session Equipment Utilized During Treatment:  Rolling walker Nurse Communication: Mobility status  Activity Tolerance: Patient tolerated treatment well Patient left: in bed;with call bell/phone within reach;with bed alarm set   Time: 1424-1446 OT Time Calculation (min): 22 min Charges:  OT General Charges $OT Visit: 1 Procedure OT Evaluation $Initial OT Evaluation Tier I: 1 Procedure OT Treatments $Self Care/Home Management : 8-22 mins Erin Davis OTR/L 05/27/2014, 3:02 PM

## 2014-05-27 NOTE — Evaluation (Signed)
Physical Therapy Evaluation Patient Details Name: Erin ClampDorothy M Davis MRN: [id number] DOB: 11/06/1921 Today's Date: 05/27/2014   History of Present Illness  Erin Davis is a 80 y.o. female with dementia, presents to ED from her NH with R arm redness, pain, swelling for past 2 days.  Per staff arm is actually improving however they wanted it looked at.  Patient at baseline mentation.    Clinical Impression  Patient presents with mild balance deficits and impaired safety awareness most likely secondary to baseline dementia. Recommend 24/7 supervision/assist at memory unit facility with all mobility and functional tasks due to pt being high falls risk. Pt would benefit from skilled PT to improve balance, strength and mobility while in hospital so pt can maximize functional independence and ease burden of care prior to return to memory unit.    Follow Up Recommendations SNF;Supervision/Assistance - 24 hour (Return to memory unit)    Equipment Recommendations  None recommended by PT    Recommendations for Other Services       Precautions / Restrictions Precautions Precautions: Fall Precaution Comments: baseline dementia Restrictions Weight Bearing Restrictions: No      Mobility  Bed Mobility Overal bed mobility: Needs Assistance Bed Mobility: Supine to Sit;Sit to Supine     Supine to sit: Min guard;HOB elevated Sit to supine: Min guard   General bed mobility comments: Min guard for safety.  Transfers Overall transfer level: Needs assistance Equipment used: None Transfers: Sit to/from Stand Sit to Stand: Min assist         General transfer comment: Min A to stand from EOB.  Ambulation/Gait Ambulation/Gait assistance: Min assist Ambulation Distance (Feet): 150 Feet Assistive device: None Gait Pattern/deviations: Step-through pattern;Decreased stride length;Drifts right/left;Narrow base of support   Gait velocity interpretation: Below normal speed for  age/gender General Gait Details: Pt with slow, unsteady gait with Min A due to 2 LOB due to  distactions in hallway.  Stairs            Wheelchair Mobility    Modified Rankin (Stroke Patients Only)       Balance Overall balance assessment: Needs assistance Sitting-balance support: Feet supported;No upper extremity supported Sitting balance-Leahy Scale: Fair     Standing balance support: During functional activity Standing balance-Leahy Scale: Fair Standing balance comment: Pt able to maintain static standing at the sink while washing her hands with min guard assist.  Min hand held assist needed to ambulate back to the bed from the sink.                             Pertinent Vitals/Pain Pain Assessment: Faces Faces Pain Scale: No hurt    Home Living Family/patient expects to be discharged to:: Other (Comment)                      Prior Function Level of Independence: Needs assistance               Hand Dominance   Dominant Hand: Right    Extremity/Trunk Assessment   Upper Extremity Assessment: Defer to OT evaluation           Lower Extremity Assessment: Overall WFL for tasks assessed      Cervical / Trunk Assessment: Kyphotic  Communication   Communication: No difficulties  Cognition Arousal/Alertness: Awake/alert Behavior During Therapy: WFL for tasks assessed/performed Overall Cognitive Status: History of cognitive impairments - at baseline  General Comments General comments (skin integrity, edema, etc.): Easily redirected.    Exercises        Assessment/Plan    PT Assessment Patient needs continued PT services  PT Diagnosis Difficulty walking   PT Problem List Decreased balance;Decreased mobility;Decreased cognition;Decreased safety awareness  PT Treatment Interventions Balance training;Neuromuscular re-education;Gait training;Therapeutic activities;Patient/family education   PT  Goals (Current goals can be found in the Care Plan section) Acute Rehab PT Goals PT Goal Formulation: Patient unable to participate in goal setting Time For Goal Achievement: 06/10/14 Potential to Achieve Goals: Fair    Frequency Min 2X/week   Barriers to discharge        Co-evaluation               End of Session Equipment Utilized During Treatment: Gait belt Activity Tolerance: Patient tolerated treatment well Patient left: in bed;with call bell/phone within reach;with bed alarm set Nurse Communication: Mobility status         Time: 1520-1535 PT Time Calculation (min) (ACUTE ONLY): 15 min   Charges:   PT Evaluation $Initial PT Evaluation Tier I: 1 Procedure PT Treatments $Gait Training: 8-22 mins   PT G CodesAlvie Heidelberg:          Folan, Farin Buhman A 05/27/2014, 3:40 PM  Alvie HeidelbergShauna Folan, PT, DPT [phone]

## 2014-05-27 NOTE — Clinical Social Work Note (Signed)
[facility] [facility] Pt's daughter, Alethia BertholdLinda Harper, updated regarding discharge.  [facility] Report number: [id number] [address]: [address])  Marcelline DeistEmily Nicolina Hirt, [place] [phone]) Licensed Clinical Social Worker Orthopedics [phone]([id number]) and Surgical [phone]([id number])

## 2014-05-27 NOTE — Discharge Summary (Signed)
Physician Discharge Summary  Erin ClampDorothy M Davis ZOX:[id number] DOB: 01/08/1922 DOA: 05/25/2014  PCP: Florentina JennyRIPP, HENRY, MD  Admit date: 05/25/2014 Discharge date: 05/27/2014  Time spent: 65 minutes  Recommendations for Outpatient Follow-up:  1. Follow-up with PCP for follow up of right arm cellulitis, and management of medical illness. 2. Discharge to Assisted Living Facility  Discharge Diagnoses:  Active Problems:   Right arm cellulitis   Discharge Condition: Stable  Diet recommendation: Heart healthy diet  Filed Weights   05/26/14 1412  Weight: 49.669 kg (109 lb 8 oz)    History of present illness:  Erin SergeDorothy Davis is a 80 yo female with PMH of dementia, Alzheimer's disease, HTN, Hypothyroidism, and Chronic pain, that presented to the ED with right arm swelling, redness, pain for the past 2 days.  Per ALF staff, arm has improved in appearance but wanted it to be further assessed.  Patient at baseline mentation.  No evidence of fever, chills, chest pain, shortness of breath.   Hospital Course:  Staphylococcus cellulitis  -Patient presented with right arm area of swelling, redness, tenderness, and with evidence of abscess. Incision and drainage of the abscess was performed in the ED, and she was placed on IV vancomycin for 2 days. Patient remained hemodynamically stable. Wound cultures with evidence of staph aureus cellulitis, Gram stain pending.  On discharge, patient is afebrile, no leukocytosis, and area of cellulitis significantly decreased with no drainage -Continue Keflex 500 MG TID and doxycycline 100 MG  BID a day for 7 days. -Follow-up with PCP for follow up on cellulitis of arm  Hypertension -Blood pressure stable on discharge.  Continue amlodipine 2.5 mg and benazepril 10 MG daily  Dementia Patient  is at baseline mentation and alert only to self. -Continue Namenda 10 mg daily  Hypothyroidism -Continue Levothyroxine 25 MCG daily  Hyperlipidemia -Continue simvastatin  5 MG daily  GERD -Continue omeprazole 20 MG daily  Chronic pain -Continue tramadol 50 MG daily  Chronic constipation -Continue MiraLAX and Senokot  PRN    Procedures:  Incision and Drainage of right elbow abscess in ED  Consultations:  None  Discharge Exam: Filed Vitals:   05/27/14 0525  BP: 150/77  Pulse: 73  Temp: 97.8 F (36.6 C)  Resp: 16     Exam General: Demented patient only alert to self. In NAD Cardiovascular: Regular rate and rhythm.  No murmurs, rubs, or gallops. Respiratory: Clear to auscultate bilaterally.  No rhonchi or crepitations. Abdomen: Soft nontender bowel sounds present. No guarding or rigidity.  Musculoskeletal:  Right elbow with 2cm area of erythema. No warmth, tenderness, dec in range of motion, or drainage.  Discharge Instructions   Discharge Instructions    Diet - low sodium heart healthy    Complete by:  As directed      Increase activity slowly    Complete by:  As directed             Medication List    TAKE these medications        amLODipine 2.5 MG tablet  Commonly known as:  NORVASC  Take 2.5 mg by mouth daily.     aspirin 81 MG EC tablet  Take 81 mg by mouth daily.     benazepril 10 MG tablet  Commonly known as:  LOTENSIN  Take 10 mg by mouth at bedtime.     cephALEXin 500 MG capsule  Commonly known as:  KEFLEX  Take 1 capsule (500 mg total) by mouth 3 (three) times daily.  Cranberry Fruit 475 MG Caps  Take 1 capsule by mouth 2 (two) times daily.     doxycycline 100 MG tablet  Commonly known as:  VIBRA-TABS  Take 1 tablet (100 mg total) by mouth 2 (two) times daily.     erythromycin ophthalmic ointment  Place 1 application into the right ear 2 (two) times daily. To scab on right ear twice daily until healed     eucerin cream  Apply 1 application topically 2 (two) times daily. Apply to upper and lower extremities twice daily     hydrocortisone cream 1 %  Apply 1 application topically 2 (two) times  daily as needed for itching (hemorrhoids).     levothyroxine 25 MCG tablet  Commonly known as:  SYNTHROID, LEVOTHROID  Take 37.5 mcg by mouth daily before breakfast.     loratadine 10 MG tablet  Commonly known as:  CLARITIN  Take 10 mg by mouth daily.     memantine 10 MG tablet  Commonly known as:  NAMENDA  Take 10 mg by mouth 2 (two) times daily.     multivitamins with iron Tabs tablet  Take 1 tablet by mouth daily.     omeprazole 20 MG capsule  Commonly known as:  PRILOSEC  Take 20 mg by mouth daily.     polyethylene glycol packet  Commonly known as:  MIRALAX / GLYCOLAX  Take 17 g by mouth daily.     senna-docusate 8.6-50 MG per tablet  Commonly known as:  Senokot-S  Take 2 tablets by mouth at bedtime.     simvastatin 5 MG tablet  Commonly known as:  ZOCOR  Take 5 mg by mouth at bedtime.     traMADol 50 MG tablet  Commonly known as:  ULTRAM  Take 50 mg by mouth 3 (three) times daily.     triamcinolone 0.025 % cream  Commonly known as:  KENALOG  Apply 1 application topically 2 (two) times daily. Apply to rash on left arm       No Known Allergies     Follow-up Information    Follow up with Florentina Jenny, MD In 1 week.   Specialty:  Family Medicine   Contact information:   [address] [place] [address] [phone]        The results of significant diagnostics from this hospitalization (including imaging, microbiology, ancillary and laboratory) are listed below for reference.    Significant Diagnostic Studies: No results found.  Microbiology: Recent Results (from the past 240 hour(s))  Wound culture     Status: None (Preliminary result)   Collection Time: 05/26/14  6:10 AM  Result Value Ref Range Status   Specimen Description WOUND RIGHT FOREARM  Final   Special Requests NONE  Final   Gram Stain PENDING  Incomplete   Culture   Final    MODERATE STAPHYLOCOCCUS SPECIES Performed at [facility]    Report Status  PENDING  Incomplete     Labs: Basic Metabolic Panel:  Recent Labs Lab 05/25/14 2240 05/27/14 0337  NA 147 141  K 4.3 3.8  CL 106 102  CO2 30 26  GLUCOSE 124* 101*  BUN 26* 14  CREATININE 0.90 0.75  CALCIUM 9.1 9.0   Liver Function Tests: No results for input(s): AST, ALT, ALKPHOS, BILITOT, PROT, ALBUMIN in the last 168 hours. No results for input(s): LIPASE, AMYLASE in the last 168 hours. No results for input(s): AMMONIA in the last 168 hours. CBC:  Recent Labs Lab 05/25/14 2240 05/27/14 0337  WBC 9.6 5.2  HGB 12.0 11.8*  HCT 36.6 35.4*  MCV 95.6 91.5  PLT 240 242   Cardiac Enzymes: No results for input(s): CKTOTAL, CKMB, CKMBINDEX, TROPONINI in the last 168 hours. BNP: BNP (last 3 results) No results for input(s): PROBNP in the last 8760 hours. CBG: No results for input(s): GLUCAP in the last 168 hours.     Signed:  Illa LevelOsman, Sahar PA-C  [facility] 05/27/2014, 11:16 AM

## 2014-05-27 NOTE — Progress Notes (Signed)
ANTIBIOTIC CONSULT NOTE - FOLLOW UP  Pharmacy Consult for Vancomycin Indication: R-arm cellulitis  No Known Allergies  Patient Measurements: Weight: 109 lb 8 oz (49.669 kg)   Vital Signs: Temp: 97.8 F (36.6 C) (12/15 0525) Temp Source: Axillary (12/15 0525) BP: 150/77 mmHg (12/15 0525) Pulse Rate: 73 (12/15 0525) Intake/Output from previous day: 12/14 0701 - 12/15 0700 In: 990 [P.O.:990] Out: -  Intake/Output from this shift:    Labs:  Recent Labs  05/25/14 2240 05/27/14 0337  WBC 9.6 5.2  HGB 12.0 11.8*  PLT 240 242  CREATININE 0.90 0.75   CrCl cannot be calculated (Unknown ideal weight.). No results for input(s): VANCOTROUGH, VANCOPEAK, VANCORANDOM, GENTTROUGH, GENTPEAK, GENTRANDOM, TOBRATROUGH, TOBRAPEAK, TOBRARND, AMIKACINPEAK, AMIKACINTROU, AMIKACIN in the last 72 hours.   Microbiology: Recent Results (from the past 720 hour(s))  Wound culture     Status: None (Preliminary result)   Collection Time: 05/26/14  6:10 AM  Result Value Ref Range Status   Specimen Description WOUND RIGHT FOREARM  Final   Special Requests NONE  Final   Gram Stain PENDING  Incomplete   Culture   Final    MODERATE STAPHYLOCOCCUS SPECIES [facility]formed at [facility]    Report Status PENDING  Incomplete    Anti-infectives    Start     Dose/Rate Route Frequency Ordered Stop   05/26/14 2200  vancomycin (VANCOCIN) IVPB 750 mg/150 ml premix     750 mg150 mL/hr over 60 Minutes Intravenous Every 24 hours 05/26/14 0136     05/25/14 2200  piperacillin-tazobactam (ZOSYN) IVPB 3.375 g     3.375 g100 mL/hr over 30 Minutes Intravenous  Once 05/25/14 2147 05/25/14 2306   05/25/14 2200  vancomycin (VANCOCIN) IVPB 750 mg/150 ml premix  Status:  Discontinued     750 mg150 mL/hr over 60 Minutes Intravenous  Once 05/25/14 2149 05/25/14 2155   05/25/14 2200  vancomycin (VANCOCIN) IVPB 1000 mg/200 mL premix     1,000 mg200 mL/hr over 60 Minutes Intravenous  Once 05/25/14 2155 05/26/14 0046      Assessment: 89 YOF who presented with R-arm redness pain/swelling x 2 days. The site underwent I&D in the ED with cultures now growing staph aureus (pending sensitivities). The patient's weight was updated to be 49.7 kg, estimated CrCl~30 ml/min - will adjust Vancomycin dose accordingly and monitor culture results for opportunity to de-escalate to oral antibiotics.   Vanc 12/14 >>   12/14 WCx >> moderate SA (pending sens)  Goal of Therapy:  Vancomycin trough level 10-15 mcg/ml  Plan:  1. Reduce Vancomycin to 500 mg IV every 24 hours (next dose due at 0600 on 12/16) 2. Will continue to follow renal function, culture results, LOT, and antibiotic de-escalation plans   Georgina PillionElizabeth Makia Bossi, PharmD, BCPS Clinical Pharmacist Pager: [phone] 05/27/2014 9:55 AM

## 2014-05-28 LAB — WOUND CULTURE

## 2015-04-14 DEATH — deceased
